# Patient Record
Sex: Male | Born: 1945
Health system: Southern US, Community
[De-identification: ages and names within clinical notes are randomized; demographics above are authoritative.]

## PROBLEM LIST (undated history)

## (undated) DIAGNOSIS — C61 Malignant neoplasm of prostate: Secondary | ICD-10-CM

## (undated) DIAGNOSIS — I10 Essential (primary) hypertension: Secondary | ICD-10-CM

## (undated) DIAGNOSIS — M1990 Unspecified osteoarthritis, unspecified site: Secondary | ICD-10-CM

## (undated) HISTORY — PX: PROSTATE CRYOABLATION: SUR358

## (undated) HISTORY — PX: APPENDECTOMY: SHX54

## (undated) HISTORY — PX: OTHER SURGICAL HISTORY: SHX169

## (undated) HISTORY — DX: Essential (primary) hypertension: I10

## (undated) HISTORY — DX: Malignant neoplasm of prostate: C61

## (undated) HISTORY — PX: NASAL SINUS SURGERY: SHX719

## (undated) HISTORY — DX: Unspecified osteoarthritis, unspecified site: M19.90

---

## 2018-08-11 DIAGNOSIS — N183 Chronic kidney disease, stage 3 unspecified: Secondary | ICD-10-CM

## 2018-08-11 HISTORY — DX: Chronic kidney disease, stage 3 unspecified: N18.30

## 2019-09-28 ENCOUNTER — Ambulatory Visit (INDEPENDENT_AMBULATORY_CARE_PROVIDER_SITE_OTHER): Payer: Medicare Other | Admitting: Medical

## 2019-09-28 ENCOUNTER — Encounter: Payer: Self-pay | Admitting: Medical

## 2019-09-28 ENCOUNTER — Other Ambulatory Visit: Payer: Self-pay

## 2019-09-28 VITALS — BP 158/90 | HR 83 | Temp 98.2°F | Ht 71.0 in | Wt 195.4 lb

## 2019-09-28 DIAGNOSIS — R350 Frequency of micturition: Secondary | ICD-10-CM

## 2019-09-28 DIAGNOSIS — Z2820 Immunization not carried out because of patient decision for unspecified reason: Secondary | ICD-10-CM

## 2019-09-28 DIAGNOSIS — Z0000 Encounter for general adult medical examination without abnormal findings: Secondary | ICD-10-CM | POA: Insufficient documentation

## 2019-09-28 DIAGNOSIS — Z1211 Encounter for screening for malignant neoplasm of colon: Secondary | ICD-10-CM

## 2019-09-28 DIAGNOSIS — G4733 Obstructive sleep apnea (adult) (pediatric): Secondary | ICD-10-CM | POA: Diagnosis not present

## 2019-09-28 DIAGNOSIS — R7309 Other abnormal glucose: Secondary | ICD-10-CM | POA: Insufficient documentation

## 2019-09-28 DIAGNOSIS — C61 Malignant neoplasm of prostate: Secondary | ICD-10-CM

## 2019-09-28 DIAGNOSIS — N1831 Chronic kidney disease, stage 3a: Secondary | ICD-10-CM | POA: Insufficient documentation

## 2019-09-28 DIAGNOSIS — I10 Essential (primary) hypertension: Secondary | ICD-10-CM | POA: Diagnosis not present

## 2019-09-28 MED ORDER — AMLODIPINE BESYLATE 5 MG PO TABS: 5.0000 mg | ORAL_TABLET | Freq: Every day | ORAL | 0 refills | Status: DC

## 2019-09-28 MED ORDER — TAMSULOSIN HCL 0.4 MG PO CAPS: 0.4000 mg | ORAL_CAPSULE | Freq: Every day | ORAL | 0 refills | Status: DC

## 2019-09-28 MED ORDER — VALSARTAN-HYDROCHLOROTHIAZIDE 160-25 MG PO TABS: 1.0000 | ORAL_TABLET | Freq: Every day | ORAL | 0 refills | Status: DC

## 2019-09-28 NOTE — Patient Instructions (Addendum)
We will make a referral to [facility]  Consider Cologard stool test vs colonoscopy for colon cancer screen  We will check labs today.     Chronic Kidney Disease, Adult Chronic kidney disease (CKD) happens when the kidneys are damaged over a long period of time. The kidneys are two organs that help with:  Getting rid of waste and extra fluid from the blood.  Making hormones that maintain the amount of fluid in your tissues and blood vessels.  Making sure that the body has the right amount of fluids and chemicals. Most of the time, CKD does not go away, but it can usually be controlled. Steps must be taken to slow down the kidney damage or to stop it from getting worse. If this is not done, the kidneys may stop working. Follow these instructions at home: Medicines  Take over-the-counter and prescription medicines only as told by your doctor. You may need to change the amount of medicines you take.  Do not take any new medicines unless your doctor says it is okay. Many medicines can make your kidney damage worse.  Do not take any vitamin and supplements unless your doctor says it is okay. Many vitamins and supplements can make your kidney damage worse. General instructions  Follow a diet as told by your doctor. You may need to stay away from: ? Alcohol. ? Salty foods. ? Foods that are high in:  Potassium.  Calcium.  Protein.  Do not use any products that contain nicotine or tobacco, such as cigarettes and e-cigarettes. If you need help quitting, ask your doctor.  Keep track of your blood pressure at home. Tell your doctor about any changes.  If you have diabetes, keep track of your blood sugar as told by your doctor.  Try to stay at a healthy weight. If you need help, ask your doctor.  Exercise at least 30 minutes a day, 5 days a week.  Stay up-to-date with your shots (immunizations) as told by your doctor.  Keep all follow-up visits as told by your doctor. This  is important. Contact a doctor if:  Your symptoms get worse.  You have new symptoms. Get help right away if:  You have symptoms of end-stage kidney disease. These may include: ? Headaches. ? Numbness in your hands or feet. ? Easy bruising. ? Having hiccups often. ? Chest pain. ? Shortness of breath. ? Stopping of menstrual periods in women.  You have a fever.  You have very little pee (urine).  You have pain or bleeding when you pee. Summary  Chronic kidney disease (CKD) happens when the kidneys are damaged over a long period of time.  Most of the time, this condition does not go away, but it can usually be controlled. Steps must be taken to slow down the kidney damage or to stop it from getting worse.  Treatment may include a combination of medicines and lifestyle changes. This information is not intended to replace advice given to you by your health care provider. Make sure you discuss any questions you have with your health care provider. Document Revised: 07/10/2017 Document Reviewed: 09/01/2016 Elsevier Patient Education  2020 [employer].

## 2019-09-28 NOTE — Progress Notes (Signed)
Subjective:  Peter Russell is a 74 y.o. male who presents for Chief Complaint  Patient presents with  . New Patient (Initial Visit)    needs blood work and referral to neurology   . Hypertension    needs blood pressure medicine      Here as a new patient today to establish care and get urology referral.  He moved here in August 2020 from the [facility].  He worked up until last year, retiring at age 54-1/2.  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    His health issues include hypertension, history of prostate cancer, arthritis, and decreased urine stream but frequent urination.  Back in [place] he had several specialist  His prior PCP was Dr. Lucienne Minks  His prior urologist was Dr. Olin Pia in [place], [place].    He would see ear nose and throat from time to time for impacted cerumen  No prior colonoscopy  Typical avoids vaccines  HTN -compliant with medications, normal home readings but recently ran out of valsartan for about the past 2 to 3 weeks.  No prior stress test.  Was on cholesterol medicine in the past but ended up dropping this.  No other new complaint  No other aggravating or relieving factors.    No other c/o.  Past Medical History:  Diagnosis Date  . Arthritis    sternum and knees  . CKD (chronic kidney disease) stage 3, GFR 30-59 ml/min 2020  . Hypertension   . Prostate cancer [facility])     Past Surgical History:  Procedure Laterality Date  . APPENDECTOMY    . NASAL SINUS SURGERY    . PROSTATE CRYOABLATION      Social History   Socioeconomic History  . Marital status: Married    Spouse name: Not on file  . Number of children: Not on file  . Years of education: Not on file  . Highest education level: Not on file  Occupational History  . Not on file  Tobacco Use  . Smoking status: Former Research scientist (life sciences)  . Smokeless tobacco: Never Used  Substance and Sexual Activity  . Alcohol use: Never  . Drug use: Never  . Sexual activity: Not  on file  Other Topics Concern  . 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 Was living in [place] prior, moved to [place] fall 2020.   As of  09/2019   Social Determinants of Health   Financial Resource Strain:   . Difficulty of Paying Living Expenses: Not on file  Food Insecurity:   . Worried About Charity fundraiser in the Last Year: Not on file  . Ran Out of Food in the Last Year: Not on file  Transportation Needs:   . Lack of Transportation (Medical): Not on file  . Lack of Transportation (Non-Medical): Not on file  Physical Activity:   . Days of Exercise per Week: Not on file  . Minutes of Exercise per Session: Not on file  Stress:   . Feeling of Stress : Not on file  Social Connections:   . Frequency of Communication with Friends and Family: Not on file  . Frequency of Social Gatherings with Friends and Family: Not on file  . Attends Religious Services: Not on file  . Active Member of Clubs or Organizations: Not on file  . Attends Archivist Meetings: Not on file  .  Marital Status: Not on file  Intimate Partner Violence:   . Fear of Current or Ex-Partner: Not on file  . Emotionally Abused: Not on file  . Physically Abused: Not on file  . Sexually Abused: Not on file    Family History  Problem Relation Age of Onset  . Alcohol abuse Father   . Diabetes Father   . Cancer Brother        prostate     Current Outpatient Medications:  .  amLODipine (NORVASC) 5 MG tablet, Take 1 tablet (5 mg total) by mouth daily., Disp: 90 tablet, Rfl: 0 .  tamsulosin (FLOMAX) 0.4 MG CAPS capsule, Take 1 capsule (0.4 mg total) by mouth daily., Disp: 90 capsule, Rfl: 0 .  valsartan-hydrochlorothiazide (DIOVAN-HCT) 160-25 MG tablet, Take 1 tablet by mouth daily., Disp: 90 tablet, Rfl: 0  No Known Allergies   The following portions of the patient's history were  reviewed and updated as appropriate: allergies, current medications, past family history, past medical history, past social history, past surgical history and problem list.  ROS Otherwise as in subjective above  Objective: BP (!) 158/90   Pulse 83   Temp 98.2 F (36.8 C)   Ht 5\' 11"  (1.803 m)   Wt 195 lb 6.4 oz (88.6 kg)   SpO2 98%   BMI 27.25 kg/m   General appearance: alert, no distress, well developed, well nourished, white male Neck: supple, no lymphadenopathy, no thyromegaly, no masses, no bruits Heart: RRR, normal S1, S2, faint 2/6 brief systolic murmur heard in left upper sternal border Lungs: CTA bilaterally, no wheezes, rhonchi, or rales Abdomen: +bs, soft, non tender, non distended, no masses, no hepatomegaly, no splenomegaly, no bruits Pulses: 2+ radial pulses, 2+ pedal pulses, normal cap refill Ext: no edema    Assessment: Encounter Diagnoses  Name Primary?  . Prostate cancer ([place]) Yes  . Essential hypertension, benign   . Urine frequency   . OSA (obstructive sleep apnea)   . Chronic kidney disease, stage 3a   . Elevated glucose   . Vaccine refused by patient   . Screen for colon cancer      Plan: History of prostate cancer with prior cryoablation surgery, including treatments in 2013, 2018, last PSA May of this past year 2020 was around 5.  He continues on tamsulosin for lower tract symptoms.  Referral to urology  Hypertension-he notes normal home readings but has been out of medicine the last 2 weeks.  Refills today.  Labs today.  CKD 3-reviewing his labs he appears to have chronic kidney disease although he was not aware of this.  We discussed this diagnosis, the relationship to high blood pressure.  Labs today.  Avoid NSAIDs.  Elevated glucose-repeat labs today.  I reviewed labs he had done about a year ago showing mildly elevated glucose  In our general discussion today he seemed to be pretty set on avoiding vaccines.  We discussed given his age he is  high risk for certain things like shingles and pneumonia but he declines vaccines today.  We discussed Cologuard versus colonoscopy.  He will think about this and consider.  He has never had a colon cancer screen   [facility] was seen today for new patient (initial visit) and hypertension.  Diagnoses and all orders for this visit:  Prostate cancer ([place]) -     PSA -     Ambulatory referral to Urology -     Ambulatory referral to Urology  Essential hypertension, benign -  Comprehensive metabolic panel -     Lipid panel  Urine frequency -     Comprehensive metabolic panel  OSA (obstructive sleep apnea)  Chronic kidney disease, stage 3a -     Comprehensive metabolic panel -     CBC with Differential/Platelet -     Lipid panel  Elevated glucose -     Comprehensive metabolic panel -     Hemoglobin A1c  Vaccine refused by patient  Screen for colon cancer  Other orders -     amLODipine (NORVASC) 5 MG tablet; Take 1 tablet (5 mg total) by mouth daily. -     valsartan-hydrochlorothiazide (DIOVAN-HCT) 160-25 MG tablet; Take 1 tablet by mouth daily. -     tamsulosin (FLOMAX) 0.4 MG CAPS capsule; Take 1 capsule (0.4 mg total) by mouth daily.    Follow up: pending labs, call back

## 2019-09-29 LAB — CBC WITH DIFFERENTIAL/PLATELET
Basophils Absolute: 0 10*3/uL (ref 0.0–0.2)
Basos: 1 %
EOS (ABSOLUTE): 0.2 10*3/uL (ref 0.0–0.4)
Eos: 3 %
Hematocrit: 41.4 % (ref 37.5–51.0)
Hemoglobin: 14.3 g/dL (ref 13.0–17.7)
Immature Grans (Abs): 0 10*3/uL (ref 0.0–0.1)
Immature Granulocytes: 0 %
Lymphocytes Absolute: 1.3 10*3/uL (ref 0.7–3.1)
Lymphs: 20 %
MCH: 29.7 pg (ref 26.6–33.0)
MCHC: 34.5 g/dL (ref 31.5–35.7)
MCV: 86 fL (ref 79–97)
Monocytes Absolute: 0.6 10*3/uL (ref 0.1–0.9)
Monocytes: 9 %
Neutrophils Absolute: 4.4 10*3/uL (ref 1.4–7.0)
Neutrophils: 67 %
Platelets: 275 10*3/uL (ref 150–450)
RBC: 4.81 x10E6/uL (ref 4.14–5.80)
RDW: 13.5 % (ref 11.6–15.4)
WBC: 6.5 10*3/uL (ref 3.4–10.8)

## 2019-09-29 LAB — COMPREHENSIVE METABOLIC PANEL
ALT: 16 IU/L (ref 0–44)
AST: 16 IU/L (ref 0–40)
Albumin/Globulin Ratio: 1.6 (ref 1.2–2.2)
Albumin: 4.4 g/dL (ref 3.7–4.7)
Alkaline Phosphatase: 112 IU/L (ref 39–117)
BUN/Creatinine Ratio: 16 (ref 10–24)
BUN: 18 mg/dL (ref 8–27)
Bilirubin Total: 0.6 mg/dL (ref 0.0–1.2)
CO2: 25 mmol/L (ref 20–29)
Calcium: 9 mg/dL (ref 8.6–10.2)
Chloride: 100 mmol/L (ref 96–106)
Creatinine, Ser: 1.1 mg/dL (ref 0.76–1.27)
GFR calc Af Amer: 77 mL/min/{1.73_m2} (ref >59)
GFR calc non Af Amer: 67 mL/min/{1.73_m2} (ref >59)
Globulin, Total: 2.8 g/dL (ref 1.5–4.5)
Glucose: 99 mg/dL (ref 65–99)
Potassium: 3.7 mmol/L (ref 3.5–5.2)
Sodium: 140 mmol/L (ref 134–144)
Total Protein: 7.2 g/dL (ref 6.0–8.5)

## 2019-09-29 LAB — HEMOGLOBIN A1C
Est. average glucose Bld gHb Est-mCnc: 114 mg/dL
Hgb A1c MFr Bld: 5.6 % (ref 4.8–5.6)

## 2019-09-29 LAB — LIPID PANEL
Chol/HDL Ratio: 4.2 ratio (ref 0.0–5.0)
Cholesterol, Total: 210 mg/dL — ABNORMAL HIGH (ref 100–199)
HDL: 50 mg/dL (ref >39)
LDL Chol Calc (NIH): 141 mg/dL — ABNORMAL HIGH (ref 0–99)
Triglycerides: 105 mg/dL (ref 0–149)
VLDL Cholesterol Cal: 19 mg/dL (ref 5–40)

## 2019-09-29 LAB — PSA: Prostate Specific Ag, Serum: 16.5 ng/mL — ABNORMAL HIGH (ref 0.0–4.0)

## 2019-11-07 ENCOUNTER — Encounter: Payer: Self-pay | Admitting: Medical

## 2019-11-10 ENCOUNTER — Other Ambulatory Visit (HOSPITAL_COMMUNITY): Payer: Self-pay | Admitting: Urology

## 2019-11-10 ENCOUNTER — Other Ambulatory Visit: Payer: Self-pay | Admitting: Urology

## 2019-11-10 DIAGNOSIS — R9721 Rising PSA following treatment for malignant neoplasm of prostate: Secondary | ICD-10-CM

## 2019-11-30 ENCOUNTER — Other Ambulatory Visit: Payer: Self-pay

## 2019-11-30 ENCOUNTER — Ambulatory Visit (HOSPITAL_COMMUNITY)
Admission: RE | Admit: 2019-11-30 | Discharge: 2019-11-30 | Disposition: A | Discharge status: Home / Self Care | Payer: Medicare Other | Source: Ambulatory Visit | Attending: Urology | Admitting: Urology

## 2019-11-30 ENCOUNTER — Encounter (HOSPITAL_COMMUNITY)
Admission: RE | Admit: 2019-11-30 | Discharge: 2019-11-30 | Disposition: A | Discharge status: Home / Self Care | Payer: Medicare Other | Source: Ambulatory Visit | Attending: Urology | Admitting: Urology

## 2019-11-30 DIAGNOSIS — R9721 Rising PSA following treatment for malignant neoplasm of prostate: Secondary | ICD-10-CM | POA: Diagnosis not present

## 2019-11-30 MED ORDER — TECHNETIUM TC 99M MEDRONATE IV KIT
20.4000 | PACK | Freq: Once | INTRAVENOUS | Status: AC | PRN
  Administered 2019-11-30: 20.4 via INTRAVENOUS

## 2019-12-27 ENCOUNTER — Other Ambulatory Visit: Payer: Self-pay | Admitting: Medical

## 2020-02-02 ENCOUNTER — Ambulatory Visit (INDEPENDENT_AMBULATORY_CARE_PROVIDER_SITE_OTHER): Payer: Medicare Other | Admitting: Medical

## 2020-02-02 ENCOUNTER — Other Ambulatory Visit: Payer: Self-pay

## 2020-02-02 ENCOUNTER — Encounter: Payer: Self-pay | Admitting: Medical

## 2020-02-02 VITALS — BP 128/70 | HR 70 | Ht 71.0 in | Wt 182.2 lb

## 2020-02-02 DIAGNOSIS — Z7189 Other specified counseling: Secondary | ICD-10-CM

## 2020-02-02 DIAGNOSIS — Z2820 Immunization not carried out because of patient decision for unspecified reason: Secondary | ICD-10-CM

## 2020-02-02 DIAGNOSIS — I10 Essential (primary) hypertension: Secondary | ICD-10-CM

## 2020-02-02 DIAGNOSIS — N1831 Chronic kidney disease, stage 3a: Secondary | ICD-10-CM | POA: Diagnosis not present

## 2020-02-02 DIAGNOSIS — R7309 Other abnormal glucose: Secondary | ICD-10-CM

## 2020-02-02 DIAGNOSIS — R350 Frequency of micturition: Secondary | ICD-10-CM

## 2020-02-02 DIAGNOSIS — Z136 Encounter for screening for cardiovascular disorders: Secondary | ICD-10-CM

## 2020-02-02 DIAGNOSIS — M1A9XX0 Chronic gout, unspecified, without tophus (tophi): Secondary | ICD-10-CM

## 2020-02-02 DIAGNOSIS — C61 Malignant neoplasm of prostate: Secondary | ICD-10-CM | POA: Diagnosis not present

## 2020-02-02 DIAGNOSIS — Z0000 Encounter for general adult medical examination without abnormal findings: Secondary | ICD-10-CM

## 2020-02-02 DIAGNOSIS — G4733 Obstructive sleep apnea (adult) (pediatric): Secondary | ICD-10-CM | POA: Diagnosis not present

## 2020-02-02 DIAGNOSIS — M069 Rheumatoid arthritis, unspecified: Secondary | ICD-10-CM | POA: Insufficient documentation

## 2020-02-02 DIAGNOSIS — Z20822 Contact with and (suspected) exposure to covid-19: Secondary | ICD-10-CM

## 2020-02-02 DIAGNOSIS — Z7185 Encounter for immunization safety counseling: Secondary | ICD-10-CM | POA: Insufficient documentation

## 2020-02-02 NOTE — Progress Notes (Addendum)
Subjective:    Peter Russell is a 74 y.o. male who presents for Preventative Services visit and chronic medical problems/med check visit.    Primary Care Provider Monnica Saltsman, Camelia Eng, PA-C here for primary care  Current Health Care Team:  Dentist, N/A  Eye doctor, N/A   Dr. Tresa Moore, [facility]    Medical Services you may have received from other than [facility] providers in the past year (date may be approximate) Dr. [facility]  Exercise Current exercise habits: walking 45 minutes 5 days a week   Nutrition/Diet Current diet: in general, a "healthy" diet    Depression Screen Depression screen [facility] 2/9 02/02/2020  Decreased Interest 0  Down, Depressed, Hopeless 0  PHQ - 2 Score 0    Activities of Daily Living Screen/Functional Status Survey Is the patient deaf or have difficulty hearing?: Yes Does the patient have difficulty seeing, even when wearing glasses/contacts?: Yes Does the patient have difficulty concentrating, remembering, or making decisions?: No Does the patient have difficulty walking or climbing stairs?: Yes (knee stiffness) Does the patient have difficulty dressing or bathing?: No Does the patient have difficulty doing errands alone such as visiting a doctor's office or shopping?: No  Can patient draw a clock face showing 11:00, yes   Fall Risk Screen Fall Risk  02/02/2020 09/28/2019  Falls in the past year? 0 0    Gait Assessment: Normal gait observed:  No, guarded with knees due to pain   Advanced directives Does patient have a [place]? No Does patient have a Living Will? No    Past Medical History:  Diagnosis Date  . Arthritis    sternum and knees  . CKD (chronic kidney disease) stage 3, GFR 30-59 ml/min 2020  . Hypertension   . Prostate cancer [facility])     Past Surgical History:  Procedure Laterality Date  . APPENDECTOMY    . NASAL SINUS SURGERY    . PROSTATE CRYOABLATION      Social History    Socioeconomic History  . Marital status: Married    Spouse name: Not on file  . Number of children: Not on file  . Years of education: Not on file  . Highest education level: Not on file  Occupational History  . Not on file  Tobacco Use  . Smoking status: Former Research scientist (life sciences)  . Smokeless tobacco: Never Used  Vaping Use  . Vaping Use: Never used  Substance and Sexual Activity  . Alcohol use: Never  . Drug use: Never  . Sexual activity: Not on file  Other Topics Concern  . 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  Has grown children.  Was living in [place] prior, moved to [place] fall 2020.   As of  09/2019   Social Determinants of Health   Financial Resource Strain:   . Difficulty of Paying Living Expenses:   Food Insecurity:   . Worried About Charity fundraiser in the Last Year:   . Arboriculturist in the Last Year:   Transportation Needs:   . Film/video editor (Medical):   Marland Kitchen Lack of Transportation (Non-Medical):   Physical Activity:   . Days of Exercise per Week:   . Minutes of Exercise per Session:   Stress:   . Feeling of Stress :   Social Connections:   . Frequency of Communication with Friends and  Family:   . Frequency of Social Gatherings with Friends and Family:   . Attends Religious Services:   . Active Member of Clubs or Organizations:   . Attends Archivist Meetings:   Marland Kitchen Marital Status:   Intimate Partner Violence:   . Fear of Current or Ex-Partner:   . Emotionally Abused:   Marland Kitchen Physically Abused:   . Sexually Abused:     Family History  Problem Relation Age of Onset  . Alcohol abuse Father   . Diabetes Father   . Cancer Brother        prostate     Current Outpatient Medications:  .  amLODipine (NORVASC) 5 MG tablet, Take 1 tablet by mouth once daily, Disp: 90 tablet, Rfl: 0 .  Coenzyme Q10 100 MG capsule, Take by  mouth., Disp: , Rfl:  .  cyanocobalamin ([employer] VITAMIN B12) 1000 MCG tablet, Take by mouth., Disp: , Rfl:  .  Garlic 10 MG CAPS, Take by mouth., Disp: , Rfl:  .  MAGNESIUM PO, Take by mouth., Disp: , Rfl:  .  Omega-3 Fatty Acids (FISH OIL) 1000 MG CAPS, Take by mouth., Disp: , Rfl:  .  tamsulosin (FLOMAX) 0.4 MG CAPS capsule, Take 1 capsule by mouth once daily, Disp: 90 capsule, Rfl: 0 .  valsartan-hydrochlorothiazide (DIOVAN-HCT) 160-25 MG tablet, Take 1 tablet by mouth daily, Disp: 90 tablet, Rfl: 0 .  XTANDI 40 MG capsule, Take 160 mg by mouth daily., Disp: , Rfl:   No Known Allergies  History reviewed: allergies, current medications, past family history, past medical history, past social history, past surgical history and problem list  Chronic issues discussed: prostate cancer ,recent established with Dr. Tresa Moore with [facility], androgen deprivation therapy for prostate cancer  Having some flare up of knee pains.  Has hx/o gout and arthritis.  Having some pains in right great toe.  Does take an OTC uric acid reducer.   Walks daily.  Trying to limit meat and processed foods.  Uses a juicer.   Objective:      Biometrics BP 128/70   Pulse 70   Ht 5\' 11"  (1.803 m)   Wt 182 lb 3.2 oz (82.6 kg)   SpO2 97%   BMI 25.41 kg/m    Wt Readings from Last 3 Encounters:  02/02/20 182 lb 3.2 oz (82.6 kg)  09/28/19 195 lb 6.4 oz (88.6 kg)   BP Readings from Last 3 Encounters:  02/02/20 128/70  09/28/19 (!) 158/90     Cognitive Testing  Alert? Yes  Normal Appearance?Yes  Oriented to person? Yes  Place? Yes   Time? Yes  Recall of three objects?  Yes  Can perform simple calculations? Yes  Displays appropriate judgment?Yes  Can read the correct time from a watch face?Yes  General appearance: alert, no distress, WD/WN, white male  Nutritional Status: Inadequate calore intake? no Loss of muscle mass? no Loss of fat beneath skin? no Localized or general edema? no Diminished  functional status? no  Other pertinent exam: Neck: supple, no lymphadenopathy, no thyromegaly, no masses, no bruits Skin: Scattered macules and skin tags neck and torso anterior and posterior, cherry hemangiomas as well small benign, no worrisome lesions Heart: Possible faint click in brief systolic murmur heard best at upper sternal borders, otherwise RRR, normal S1, S2, Lungs: CTA bilaterally, no wheezes, rhonchi, or rales Abdomen: +bs, soft, non tender, non distended, no masses, no hepatomegaly, no splenomegaly Musculoskeletal: Arthritic bony changes of knees bilaterally, no obvious swelling  or laxity, right great toe  With mild tendnerss at MCP, otherwise no swelling, no erythema, some thickened nails of right great and second toe, nontender, no swelling, no obvious deformity Extremities: no edema, no cyanosis, no clubbing Pulses: 2+ symmetric, upper and lower extremities, normal cap refill Neurological: alert, oriented x 3, CN2-12 intact, strength normal upper extremities and lower extremities, sensation normal throughout, DTRs 2+ throughout, no cerebellar signs, gait normal Psychiatric: normal affect, behavior normal, pleasant  GU/rectal - declined  EKG Indication screen for heart disease, hypertension, rate 57 bpm, PR 136 ms, QRS 92 ms, QTC 434 ms, axis 31 degrees, sinus bradycardia, there is some nonspecific ST changes in V3 through V6, P wave enlargement in 2 and 3 suggestive atrial enlargement  Assessment:   Encounter Diagnoses  Name Primary?  . Essential hypertension, benign Yes  . Prostate cancer ([place])   . Medicare annual wellness visit, subsequent   . OSA (obstructive sleep apnea)   . Chronic kidney disease, stage 3a   . Elevated glucose   . Vaccine refused by patient   . Urine frequency   . Screening for heart disease   . Vaccine counseling   . Chronic gout without tophus, unspecified cause, unspecified site   . Advance directive discussed with patient   . Close  exposure to COVID-19 virus      Plan:   A preventative services visit was completed today.  During the course of the visit today, we discussed and counseled about appropriate screening and preventive services.  A health risk assessment was established today that included a review of current medications, allergies, social history, family history, medical and preventative health history, biometrics, and preventative screenings to identify potential safety concerns or impairments.  A personalized plan was printed today for your records and use.   Personalized health advice and education was given today to reduce health risks and promote self management and wellness.  Information regarding end of life planning was discussed today.  Chronic problems discussed today: arthritis of knees -bony arthritic knees.  Discussed use of topical creams over-the-counter, Tylenol, other treatment if needed.  Hypertension-continue current medication  We discussed heart disease screening.  EKG reviewed. Recommend cardiology consult. Murmur slight click on heart exam, EKG findings, and other risk factors including age and hypertension    Acute problems discussed today: Gout ?, toe pain - labs today, discussed acute pain medications, Tylenol, topical creams over-the-counter, prescription medicines.  Follow-up pending labs    Recommendations:  I recommend a yearly ophthalmology/optometry visit for glaucoma screening and eye checkup  I recommended a yearly dental visit for hygiene and checkup  Advanced directives - discussed nature and purpose of Advanced Directives, encouraged them to complete them if they have not done so and/or encouraged them to get [place] a copy if they have done this already.  Referrals today: Consider cardiology consult  Cancer screening He recently established with [facility] for prostate cancer history, on therapy  We discussed pros and cons of colon cancer screening,  discussed recommendations from [place] preventative services task force.  He declines colon cancer screening  We discussed skin surveillance/skin cancer screen    Immunizations: Counseled on several vaccines including shingles, Covid, tetanus, Pneumovax, flu.  He declines all of them  Caedyn was seen today for medicare wellness.  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    Medicare Attestation A preventative services visit was completed today.  During the course of the visit the patient was educated and counseled about appropriate screening and preventive services.  A health risk assessment was established with the patient that included a review of current medications, allergies, social history, family history, medical and preventative health history, biometrics, and preventative screenings to identify potential safety concerns or impairments.  A personalized plan was printed today for the patient's records and use.   Personalized health advice and education was given today to reduce health risks and promote self management and wellness.  Information regarding end of life planning was discussed today.  Dorothea Ogle, PA-C   02/02/2020

## 2020-02-02 NOTE — Patient Instructions (Signed)
It was a pleasure to see you today  Recommendations See your eye doctor yearly  See your dentist yearly  Follow-up with urology as planned   Cancer screens: Consider Cologuard colon cancer screening. If you want to do this let me know and we will refer  Check your skin regularly for new skin moles or lesions    Vaccines: You are due for a whole host of vaccines including yearly flu shot, Prevnar 13 and pneumococcal 23 pneumococcal vaccines, Shingrix shingles vaccine, Covid vaccine, tetanus booster  If you would like to do any of these vaccines call back to schedule. Check insurance coverage to make sure they are covered by insurance     Heart disease screening Your EKG is abnormal, and you have risk factors for heart disease including age over 46 and hypertension  Therefore, it would be a reasonable idea to have a baseline heart doctor evaluation. If you are agreeable I will refer to cardiology    Arthritis of the knees, possible gout We are checking labs today  You can use topical remedies such as Aspercreme or capsaicin cream topically to the knees, you can use Tylenol oral tablet over-the-counter, sometimes we do steroid injections into the knees, sometimes we use stronger pain medicine as needed    Advance Directive  Advance directives are legal documents that let you make choices ahead of time about your health care and medical treatment in case you become unable to communicate for yourself. Advance directives are a way for you to make known your wishes to family, friends, and health care providers. This can let others know about your end-of-life care if you become unable to communicate. Discussing and writing advance directives should happen over time rather than all at once. Advance directives can be changed depending on your situation and what you want, even after you have signed the advance directives. There are different types of advance directives, such  as:  Medical power of attorney.  Living will.  Do not resuscitate (DNR) or do not attempt resuscitation (DNAR) order. Health care proxy and medical power of attorney A health care proxy is also called a health care agent. This is a person who is appointed to make medical decisions for you in cases where you are unable to make the decisions yourself. Generally, people choose someone they know well and trust to represent their preferences. Make sure to ask this person for an agreement to act as your proxy. A proxy may have to exercise judgment in the event of a medical decision for which your wishes are not known. A medical power of attorney is a legal document that names your health care proxy. Depending on the laws in your state, after the document is written, it may also need to be:  Signed.  Notarized.  Dated.  Copied.  Witnessed.  Incorporated into your medical record. You may also want to appoint someone to manage your money in a situation in which you are unable to do so. This is called a durable power of attorney for finances. It is a separate legal document from the durable power of attorney for health care. You may choose the same person or someone different from your health care proxy to act as your agent in money matters. If you do not appoint a proxy, or if there is a concern that the proxy is not acting in your best interests, a court may appoint a guardian to act on your behalf. Living will A living will  is a set of instructions that state your wishes about medical care when you cannot express them yourself. Health care providers should keep a copy of your living will in your medical record. You may want to give a copy to family members or friends. To alert caregivers in case of an emergency, you can place a card in your wallet to let them know that you have a living will and where they can find it. A living will is used if you become:  Terminally ill.  Disabled.  Unable  to communicate or make decisions. Items to consider in your living will include:  To use or not to use life-support equipment, such as dialysis machines and breathing machines (ventilators).  A DNR or DNAR order. This tells health care providers not to use cardiopulmonary resuscitation (CPR) if breathing or heartbeat stops.  To use or not to use tube feeding.  To be given or not to be given food and fluids.  Comfort (palliative) care when the goal becomes comfort rather than a cure.  Donation of organs and tissues. A living will does not give instructions for distributing your money and property if you should pass away. DNR or DNAR A DNR or DNAR order is a request not to have CPR in the event that your heart stops beating or you stop breathing. If a DNR or DNAR order has not been made and shared, a health care provider will try to help any patient whose heart has stopped or who has stopped breathing. If you plan to have surgery, talk with your health care provider about how your DNR or DNAR order will be followed if problems occur. What if I do not have an advance directive? If you do not have an advance directive, some states assign family decision makers to act on your behalf based on how closely you are related to them. Each state has its own laws about advance directives. You may want to check with your health care provider, attorney, or state representative about the laws in your state. Summary  Advance directives are the legal documents that allow you to make choices ahead of time about your health care and medical treatment in case you become unable to tell others about your care.  The process of discussing and writing advance directives should happen over time. You can change the advance directives, even after you have signed them.  Advance directives include DNR or DNAR orders, living wills, and designating an agent as your medical power of attorney. This information is not  intended to replace advice given to you by your health care provider. Make sure you discuss any questions you have with your health care provider. Document Revised: 02/24/2019 Document Reviewed: 02/24/2019 Elsevier Patient Education  [place].

## 2020-02-03 LAB — SAR COV2 SEROLOGY (COVID19)AB(IGG),IA: DiaSorin SARS-CoV-2 Ab, IgG: POSITIVE

## 2020-02-03 LAB — URIC ACID: Uric Acid: 6.8 mg/dL (ref 3.8–8.4)

## 2020-03-23 ENCOUNTER — Other Ambulatory Visit: Payer: Self-pay | Admitting: Medical

## 2020-05-30 NOTE — Addendum Note (Signed)
Addended by: Carlena Hurl on: 05/30/2020 10:10 PM   Modules accepted: Level of Service

## 2020-06-21 ENCOUNTER — Other Ambulatory Visit: Payer: Self-pay

## 2020-06-22 MED ORDER — AMLODIPINE BESYLATE 5 MG PO TABS
5.0000 mg | ORAL_TABLET | Freq: Every day | ORAL | 0 refills | Status: DC
Start: 2020-06-22 — End: 2020-10-26

## 2020-06-22 MED ORDER — VALSARTAN-HYDROCHLOROTHIAZIDE 160-25 MG PO TABS: 1.0000 | ORAL_TABLET | Freq: Every day | ORAL | 0 refills | Status: DC

## 2020-06-22 MED ORDER — TAMSULOSIN HCL 0.4 MG PO CAPS
0.4000 mg | ORAL_CAPSULE | Freq: Every day | ORAL | 0 refills | Status: DC
Start: 2020-06-22 — End: 2020-10-26

## 2020-09-25 ENCOUNTER — Other Ambulatory Visit: Payer: Self-pay | Admitting: Medical

## 2020-10-26 ENCOUNTER — Other Ambulatory Visit: Payer: Self-pay | Admitting: Medical

## 2020-11-02 ENCOUNTER — Ambulatory Visit: Payer: BC Managed Care – PPO | Admitting: Medical

## 2020-11-23 ENCOUNTER — Other Ambulatory Visit: Payer: Self-pay | Admitting: Medical

## 2021-02-15 ENCOUNTER — Encounter: Payer: Self-pay | Admitting: Medical

## 2021-02-15 ENCOUNTER — Ambulatory Visit (INDEPENDENT_AMBULATORY_CARE_PROVIDER_SITE_OTHER): Payer: Medicare Other | Admitting: Medical

## 2021-02-15 ENCOUNTER — Other Ambulatory Visit: Payer: Self-pay

## 2021-02-15 VITALS — BP 130/80 | HR 84 | Ht 69.25 in | Wt 199.6 lb

## 2021-02-15 DIAGNOSIS — Z7189 Other specified counseling: Secondary | ICD-10-CM

## 2021-02-15 DIAGNOSIS — I10 Essential (primary) hypertension: Secondary | ICD-10-CM

## 2021-02-15 DIAGNOSIS — Z7185 Encounter for immunization safety counseling: Secondary | ICD-10-CM

## 2021-02-15 DIAGNOSIS — M816 Localized osteoporosis [Lequesne]: Secondary | ICD-10-CM

## 2021-02-15 DIAGNOSIS — R7309 Other abnormal glucose: Secondary | ICD-10-CM

## 2021-02-15 DIAGNOSIS — Z8739 Personal history of other diseases of the musculoskeletal system and connective tissue: Secondary | ICD-10-CM

## 2021-02-15 DIAGNOSIS — Z2820 Immunization not carried out because of patient decision for unspecified reason: Secondary | ICD-10-CM | POA: Diagnosis not present

## 2021-02-15 DIAGNOSIS — R935 Abnormal findings on diagnostic imaging of other abdominal regions, including retroperitoneum: Secondary | ICD-10-CM

## 2021-02-15 DIAGNOSIS — M1A9XX0 Chronic gout, unspecified, without tophus (tophi): Secondary | ICD-10-CM

## 2021-02-15 DIAGNOSIS — Z0000 Encounter for general adult medical examination without abnormal findings: Secondary | ICD-10-CM | POA: Diagnosis not present

## 2021-02-15 DIAGNOSIS — Z1211 Encounter for screening for malignant neoplasm of colon: Secondary | ICD-10-CM

## 2021-02-15 DIAGNOSIS — N1831 Chronic kidney disease, stage 3a: Secondary | ICD-10-CM

## 2021-02-15 DIAGNOSIS — C7951 Secondary malignant neoplasm of bone: Secondary | ICD-10-CM | POA: Insufficient documentation

## 2021-02-15 DIAGNOSIS — H547 Unspecified visual loss: Secondary | ICD-10-CM

## 2021-02-15 DIAGNOSIS — G4733 Obstructive sleep apnea (adult) (pediatric): Secondary | ICD-10-CM

## 2021-02-15 DIAGNOSIS — Z136 Encounter for screening for cardiovascular disorders: Secondary | ICD-10-CM

## 2021-02-15 DIAGNOSIS — R1310 Dysphagia, unspecified: Secondary | ICD-10-CM

## 2021-02-15 DIAGNOSIS — R350 Frequency of micturition: Secondary | ICD-10-CM

## 2021-02-15 DIAGNOSIS — C61 Malignant neoplasm of prostate: Secondary | ICD-10-CM

## 2021-02-15 NOTE — Progress Notes (Addendum)
Subjective:    Peter Russell is a 75 y.o. male who presents for Preventative Services visit and chronic medical problems/med check visit.    Primary Care Provider Tysinger, Camelia Eng, PA-C here for primary care  Current Health Care Team: Dentist, Dr. Noah Charon Eye doctor,- needs one Dr. Alexis Frock, urology  Medical Services you may have received from other than [facility] providers in the past year (date may be approximate) Dr. Tresa Moore- Urology   Exercise Current exercise habits:  walking outside 15-20 minutes    Nutrition/Diet Current diet: in general, a "healthy" diet    Depression Screen Depression screen [facility] 2/9 02/15/2021  Decreased Interest 0  Down, Depressed, Hopeless 0  PHQ - 2 Score 0    Activities of Daily Living Screen/Functional Status Survey Is the patient deaf or have difficulty hearing?: Yes (3 years ago, told he might need hearing aid) Does the patient have difficulty seeing, even when wearing glasses/contacts?: Yes (wants referral-) Does the patient have difficulty concentrating, remembering, or making decisions?: No Does the patient have difficulty walking or climbing stairs?: No Does the patient have difficulty dressing or bathing?: No Does the patient have difficulty doing errands alone such as visiting a doctor's office or shopping?: No  Can patient draw a clock face showing 3:15 oclock, yes  Fall Risk Screen Fall Risk  02/15/2021 02/02/2020 09/28/2019  Falls in the past year? 0 0 0  Number falls in past yr: 0 - -  Injury with Fall? 0 - -  Risk for fall due to : No Fall Risks - -  Follow up Falls evaluation completed - -    Gait Assessment: Normal gait observed  - yes  Advanced directives Does patient have a [place]? No Does patient have a Living Will? No  Past Medical History:  Diagnosis Date   Arthritis    sternum and knees   CKD (chronic kidney disease) stage 3, GFR 30-59 ml/min (HCC) 2020   Hypertension    Prostate  cancer ([place])     Past Surgical History:  Procedure Laterality Date   APPENDECTOMY     NASAL SINUS SURGERY     PROSTATE CRYOABLATION      Social History   Socioeconomic History   Marital status: Married    Spouse name: Not on file   Number of children: Not on file   Years of education: Not on file   Highest education level: Not on file  Occupational History   Not on file  Tobacco Use   Smoking status: Former    Pack years: 0.00   Smokeless tobacco: Never  Vaping Use   Vaping Use: Never used  Substance and Sexual Activity   Alcohol use: Never   Drug use: Never   Sexual activity: Not on file  Other Topics Concern   Not on file  Social History Narrative   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  Has children.  Was living in [place] prior, moved to [place] fall 2020.   As of  02/2021   Social Determinants of Health   Financial Resource Strain: Not on file  Food Insecurity: Not on file  Transportation Needs: Not on file  Physical Activity: Not on file  Stress: Not on file  Social Connections: Not on file  Intimate Partner Violence: Not on file    Family History  Problem Relation Age of Onset   Alcohol abuse Father  Diabetes Father    Cancer Brother        prostate     Current Outpatient Medications:    amLODipine (NORVASC) 5 MG tablet, Take 1 tablet by mouth once daily, Disp: 30 tablet, Rfl: 2   ascorbic Acid (VITAMIN C) 500 MG CPCR, Take by mouth., Disp: , Rfl:    Coenzyme Q10 100 MG capsule, Take by mouth., Disp: , Rfl:    cyanocobalamin 1000 MCG tablet, Take by mouth., Disp: , Rfl:    Garlic 10 MG CAPS, Take 1,000 mg by mouth., Disp: , Rfl:    MAGNESIUM PO, Take by mouth., Disp: , Rfl:    Omega-3 Fatty Acids (FISH OIL) 1000 MG CAPS, Take by mouth., Disp: , Rfl:    Probiotic Product (PROBIOTIC DAILY PO), Take by mouth., Disp: , Rfl:    tamsulosin (FLOMAX) 0.4 MG CAPS capsule, Take  1 capsule by mouth once daily, Disp: 30 capsule, Rfl: 2   TURMERIC PO, Take by mouth., Disp: , Rfl:    valsartan-hydrochlorothiazide (DIOVAN-HCT) 160-25 MG tablet, TAKE 1 TABLET BY MOUTH ONCE DAILY . APPOINTMENT REQUIRED FOR FUTURE REFILLS, Disp: 30 tablet, Rfl: 2   vitamin E 180 MG (400 UNITS) capsule, Take by mouth., Disp: , Rfl:    XTANDI 40 MG capsule, Take 160 mg by mouth daily., Disp: , Rfl:   No Known Allergies  History reviewed: allergies, current medications, past family history, past medical history, past social history, past surgical history and problem list  Chronic issues discussed: Prostate cancer - seeing  urology, on therapy  HTN - compliant with medication    Acute issues discussed: Vision impairment, wants referral  Objective:      Biometrics BP 130/80   Pulse 84   Ht 5' 9.25" (1.759 m)   Wt 199 lb 9.6 oz (90.5 kg)   BMI 29.26 kg/m   Cognitive Testing  Alert? Yes  Normal Appearance?Yes  Oriented to person? Yes  Place? Yes   Time? Yes  Recall of three objects?  Yes  Can perform simple calculations? Yes  Displays appropriate judgment?Yes  Can read the correct time from a watch face?Yes  General appearance: alert, no distress, WD/WN, white male  Nutritional Status: Inadequate calore intake? no Loss of muscle mass? no Loss of fat beneath skin? no Localized or general edema? no Diminished functional status? no  Other pertinent exam: HEENT: normocephalic, sclerae anicteric, TMs pearly, nares patent, no discharge or erythema, pharynx normal Skin: Several scattered benign cherry hemangiomas of torso anterior and posterior, several scattered benign-appearing macules otherwise.  No worrisome lesions Oral cavity: MMM, no lesions Neck: supple, no lymphadenopathy, no thyromegaly, no masses, no deformity, relatively normal ROM Heart: RRR, normal S1, S2, no murmurs Lungs: CTA bilaterally, no wheezes, rhonchi, or rales Abdomen: +bs, soft, non tender, non  distended, no masses, no hepatomegaly, no splenomegaly Musculoskeletal: bony arthritic changes of bilat knee, otherwise nontender, no swelling, no obvious deformity Extremities: no edema, no cyanosis, no clubbing Pulses: 2+ symmetric, upper and lower extremities, normal cap refill Neurological: alert, oriented x 3, CN2-12 intact, strength normal upper extremities and lower extremities, sensation normal throughout, DTRs 2+ throughout, no cerebellar signs, gait normal Psychiatric: normal affect, behavior normal, pleasant  GU/rectal - deferred/declined    Assessment:   Encounter Diagnoses  Name Primary?   Medicare annual wellness visit, subsequent Yes   Prostate cancer ([place])    Bone metastases ([place])    Screening for heart disease    Vaccine refused by patient  Vaccine counseling    Urine frequency    Advance directive discussed with patient    Chronic gout without tophus, unspecified cause, unspecified site    Elevated glucose    Essential hypertension, benign    OSA (obstructive sleep apnea)    Chronic kidney disease, stage 3a (HCC)    Personal history of rheumatoid arthritis    Screen for colon cancer    Vision impairment    Dysphagia, unspecified type    Abnormal abdominal CT scan    Localized osteoporosis ([place])        Plan:   A preventative services visit was completed today.  During the course of the visit today, we discussed and counseled about appropriate screening and preventive services.  A health risk assessment was established today that included a review of current medications, allergies, social history, family history, medical and preventative health history, biometrics, and preventative screenings to identify potential safety concerns or impairments.   This visit was a preventative care visit, also known as wellness visit or routine physical.   Topics typically include healthy lifestyle, diet, exercise, preventative care, vaccinations, sick and well care,  proper use of emergency dept and after hours care, as well as other concerns.     Recommendations: Continue to return yearly for your annual wellness and preventative care visits.  This gives [place] a chance to discuss healthy lifestyle, exercise, vaccinations, review your chart record, and perform screenings where appropriate.  I recommend you see your eye doctor yearly for routine vision care. We are referring you to ophthalmology.  I recommend you see your dentist yearly for routine dental care including hygiene visits twice yearly.   Vaccination recommendations were reviewed  There is no immunization history on file for this patient.  Vaccines recommended at this age include the following: Yearly flu shot in the fall Shingles vaccine, 2 shots 2 months apart Pneumococcal vaccine called Prevnar 13, followed by pneumococcal 47 a year later COVID vaccination  Currently you declined vaccines    Screening for cancer: Colon cancer screening: Consider updated colon cancer screening.  You declined this today  We discussed PSA, prostate exam, and prostate cancer screening risks/benefits.     Skin cancer screening: Check your skin regularly for new changes, growing lesions, or other lesions of concern Come in for evaluation if you have skin lesions of concern.  Lung cancer screening: If you have a greater than 20 pack year history of tobacco use, then you may qualify for lung cancer screening with a chest CT scan.   Please call your insurance company to inquire about coverage for this test.  We currently don't have screenings for other cancers besides breast, cervical, colon, and lung cancers.  If you have a strong family history of cancer or have other cancer screening concerns, please let me know.    Bone health: Get at least 150 minutes of aerobic exercise weekly Get weight bearing exercise at least once weekly Bone density test:  A bone density test is an imaging test that uses  a type of X-ray to measure the amount of calcium and other minerals in your bones. The test may be used to diagnose or screen you for a condition that causes weak or thin bones (osteoporosis), predict your risk for a broken bone (fracture), or determine how well your osteoporosis treatment is working. The bone density test is recommended for females 57 and older, or females or males <81 if certain risk factors such as thyroid disease, long  term use of steroids such as for asthma or rheumatological issues, vitamin D deficiency, estrogen deficiency, family history of osteoporosis, self or family history of fragility fracture in first degree relative.  I recommend a bone density test given the bony metastases findings on prior scans.  I recommend we consider medication to help reduce the rate of bone loss.  Please call to schedule your bone density test.  [facility]  [phone] [address], [place], [place] [address]   Heart health: Get at least 150 minutes of aerobic exercise weekly Limit alcohol It is important to maintain a healthy blood pressure and healthy cholesterol numbers  Heart disease screening: Screening for heart disease includes screening for blood pressure, fasting lipids, glucose/diabetes screening, BMI height to weight ratio, reviewed of smoking status, physical activity, and diet.    Goals include blood pressure 120/80 or less, maintaining a healthy lipid/cholesterol profile, preventing diabetes or keeping diabetes numbers under good control, not smoking or using tobacco products, exercising most days per week or at least 150 minutes per week of exercise, and eating healthy variety of fruits and vegetables, healthy oils, and avoiding unhealthy food choices like fried food, fast food, high sugar and high cholesterol foods.    Other tests may possibly include EKG test, CT coronary calcium score, echocardiogram, exercise treadmill  stress test.   I recommend referral to cardiology for baseline evaluation.  Let me know if agreeable to this    Medical care options: I recommend you continue to seek care here first for routine care.  We try really hard to have available appointments Monday through Friday daytime hours for sick visits, acute visits, and physicals.  Urgent care should be used for after hours and weekends for significant issues that cannot wait till the next day.  The emergency department should be used for significant potentially life-threatening emergencies.  The emergency department is expensive, can often have long wait times for less significant concerns, so try to utilize primary care, urgent care, or telemedicine when possible to avoid unnecessary trips to the emergency department.  Virtual visits and telemedicine have been introduced since the pandemic started in 2020, and can be convenient ways to receive medical care.  We offer virtual appointments as well to assist you in a variety of options to seek medical care.    Separate significant issues discussed: Prostate cancer-he currently sees urology and had a recent follow-up.  I reviewed your March 2022 notes.  We need to look into bone density and possible treatment to help reduce rate of bone loss.  Please see your dentist to make sure they are aware that we might be starting medication to help with osteoporosis her bone density as some medications have dental risk.  I reviewed his urology notes from October 18, 2020 with Dr. Tresa Moore, initial diagnosis 2010 with grade 4 prostate cancer and a PSA of 8.4.  He had cryoablation x3 in [place].  He has continued on androgen deprivation therapy.  He continues on tamsulosin for LUTS.  They are considering putting him on Xgeva for bone disease but awaiting insurance clearance and dental clearance.  Bone metastases-call and schedule a bone density test we can get further information on moving forward with  treatment for preventing bone loss.  High blood pressure-continue current medication  History of chronic kidney disease-we will recheck kidney markers today.  Last year your kidney marker looked good.  History of rheumatoid arthritis not currently on treatment.  Labs today.  You may need to be have follow-up of rheumatoid depending on labs.  There are certain medicines that the standard of care that you are not currently utilizing.  Difficulty swallowing-if this continues to get worse we will consider swallow study for further evaluation.  Chew slowly, drink plenty of fluids with food, take small portions instead of large portions when eating  Chronic gout-I am rechecking labs today.  Overweight-I recommend you get exercise and eating healthy low-fat diet  I recommend you complete advance directives which include living will and healthcare power of attorney.  Please get [place] a copy of this as well     Arnol was seen today for fasting awv.  Diagnoses and all orders for this visit:  Medicare annual wellness visit, subsequent  Prostate cancer ([place]) -     CBC with Differential/Platelet -     Comprehensive metabolic panel -     DG Bone Density; Future  Bone metastases ([place]) -     DG Bone Density; Future  Screening for heart disease -     Lipid panel  Vaccine refused by patient  Vaccine counseling  Urine frequency  Advance directive discussed with patient  Chronic gout without tophus, unspecified cause, unspecified site -     CBC with Differential/Platelet -     Uric acid  Elevated glucose -     Comprehensive metabolic panel -     Hemoglobin A1c  Essential hypertension, benign -     CBC with Differential/Platelet -     Comprehensive metabolic panel  OSA (obstructive sleep apnea)  Chronic kidney disease, stage 3a (HCC)  Personal history of rheumatoid arthritis -     CBC with Differential/Platelet -     Comprehensive metabolic panel -     Sedimentation rate -      Uric acid -     CYCLIC CITRUL PEPTIDE ANTIBODY, IGG/IGA  Screen for colon cancer  Vision impairment -     Ambulatory referral to Ophthalmology  Dysphagia, unspecified type  Abnormal abdominal CT scan  Localized osteoporosis ([place])  -     DG Bone Density; Future  Spent > 45 minutes face to face with patient in discussion of symptoms, evaluation, plan and recommendations.    Medicare Attestation A preventative services visit was completed today.  During the course of the visit the patient was educated and counseled about appropriate screening and preventive services.  A health risk assessment was established with the patient that included a review of current medications, allergies, social history, family history, medical and preventative health history, biometrics, and preventative screenings to identify potential safety concerns or impairments.  A personalized plan was printed today for the patient's records and use.   Personalized health advice and education was given today to reduce health risks and promote self management and wellness.  Information regarding end of life planning was discussed today.  Dorothea Ogle, PA-C   02/15/2021

## 2021-02-15 NOTE — Patient Instructions (Signed)
This visit was a preventative care visit, also known as wellness visit or routine physical.   Topics typically include healthy lifestyle, diet, exercise, preventative care, vaccinations, sick and well care, proper use of emergency dept and after hours care, as well as other concerns.     Recommendations: Continue to return yearly for your annual wellness and preventative care visits.  This gives [place] a chance to discuss healthy lifestyle, exercise, vaccinations, review your chart record, and perform screenings where appropriate.  I recommend you see your eye doctor yearly for routine vision care. We are referring you to ophthalmology.  I recommend you see your dentist yearly for routine dental care including hygiene visits twice yearly.   Vaccination recommendations were reviewed  There is no immunization history on file for this patient.  Vaccines recommended at this age include the following: Yearly flu shot in the fall Shingles vaccine, 2 shots 2 months apart Pneumococcal vaccine called Prevnar 13, followed by pneumococcal 34 a year later COVID vaccination  Currently you declined vaccines    Screening for cancer: Colon cancer screening: Consider updated colon cancer screening.  You declined this today  We discussed PSA, prostate exam, and prostate cancer screening risks/benefits.     Skin cancer screening: Check your skin regularly for new changes, growing lesions, or other lesions of concern Come in for evaluation if you have skin lesions of concern.  Lung cancer screening: If you have a greater than 20 pack year history of tobacco use, then you may qualify for lung cancer screening with a chest CT scan.   Please call your insurance company to inquire about coverage for this test.  We currently don't have screenings for other cancers besides breast, cervical, colon, and lung cancers.  If you have a strong family history of cancer or have other cancer screening concerns,  please let me know.    Bone health: Get at least 150 minutes of aerobic exercise weekly Get weight bearing exercise at least once weekly Bone density test:  A bone density test is an imaging test that uses a type of X-ray to measure the amount of calcium and other minerals in your bones. The test may be used to diagnose or screen you for a condition that causes weak or thin bones (osteoporosis), predict your risk for a broken bone (fracture), or determine how well your osteoporosis treatment is working. The bone density test is recommended for females 51 and older, or females or males <16 if certain risk factors such as thyroid disease, long term use of steroids such as for asthma or rheumatological issues, vitamin D deficiency, estrogen deficiency, family history of osteoporosis, self or family history of fragility fracture in first degree relative.  I recommend a bone density test given the bony metastases findings on prior scans.  I recommend we consider medication to help reduce the rate of bone loss.  Please call to schedule your bone density test.  [facility]  [phone], [place], [place] [address]   Heart health: Get at least 150 minutes of aerobic exercise weekly Limit alcohol It is important to maintain a healthy blood pressure and healthy cholesterol numbers  Heart disease screening: Screening for heart disease includes screening for blood pressure, fasting lipids, glucose/diabetes screening, BMI height to weight ratio, reviewed of smoking status, physical activity, and diet.    Goals include blood pressure 120/80 or less, maintaining a healthy lipid/cholesterol profile, preventing diabetes or keeping diabetes numbers  under good control, not smoking or using tobacco products, exercising most days per week or at least 150 minutes per week of exercise, and eating healthy variety of fruits and vegetables, healthy oils, and  avoiding unhealthy food choices like fried food, fast food, high sugar and high cholesterol foods.    Other tests may possibly include EKG test, CT coronary calcium score, echocardiogram, exercise treadmill stress test.   I recommend referral to cardiology for baseline evaluation.  Let me know if agreeable to this    Medical care options: I recommend you continue to seek care here first for routine care.  We try really hard to have available appointments Monday through Friday daytime hours for sick visits, acute visits, and physicals.  Urgent care should be used for after hours and weekends for significant issues that cannot wait till the next day.  The emergency department should be used for significant potentially life-threatening emergencies.  The emergency department is expensive, can often have long wait times for less significant concerns, so try to utilize primary care, urgent care, or telemedicine when possible to avoid unnecessary trips to the emergency department.  Virtual visits and telemedicine have been introduced since the pandemic started in 2020, and can be convenient ways to receive medical care.  We offer virtual appointments as well to assist you in a variety of options to seek medical care.    Separate significant issues discussed: Prostate cancer-he currently sees urology and had a recent follow-up.  I reviewed your March 2022 notes.  We need to look into bone density and possible treatment to help reduce rate of bone loss.  Please see your dentist to make sure they are aware that we might be starting medication to help with osteoporosis her bone density as some medications have dental risk.  I reviewed his urology notes from October 18, 2020 with Dr. Tresa Moore, initial diagnosis 2010 with grade 4 prostate cancer and a PSA of 8.4.  He had cryoablation x3 in [place].  He has continued on androgen deprivation therapy.  He continues on tamsulosin for LUTS.  They are considering  putting him on Xgeva for bone disease but awaiting insurance clearance and dental clearance.  Bone metastases-call and schedule a bone density test we can get further information on moving forward with treatment for preventing bone loss.  High blood pressure-continue current medication  History of chronic kidney disease-we will recheck kidney markers today.  Last year your kidney marker looked good.  History of rheumatoid arthritis not currently on treatment.  Labs today.  You may need to be have follow-up of rheumatoid depending on labs.  There are certain medicines that the standard of care that you are not currently utilizing.  Difficulty swallowing-if this continues to get worse we will consider swallow study for further evaluation.  Chew slowly, drink plenty of fluids with food, take small portions instead of large portions when eating  Chronic gout-I am rechecking labs today.  Overweight-I recommend you get exercise and eating healthy low-fat diet  I recommend you complete advance directives which include living will and healthcare power of attorney.  Please get [place] a copy of this as well

## 2021-02-19 ENCOUNTER — Other Ambulatory Visit: Payer: Self-pay | Admitting: Medical

## 2021-02-19 LAB — URIC ACID: Uric Acid: 7.1 mg/dL (ref 3.8–8.4)

## 2021-02-19 LAB — CBC WITH DIFFERENTIAL/PLATELET
Basophils Absolute: 0 10*3/uL (ref 0.0–0.2)
Basos: 1 %
EOS (ABSOLUTE): 0.3 10*3/uL (ref 0.0–0.4)
Eos: 5 %
Hematocrit: 39.8 % (ref 37.5–51.0)
Hemoglobin: 14 g/dL (ref 13.0–17.7)
Immature Grans (Abs): 0 10*3/uL (ref 0.0–0.1)
Immature Granulocytes: 0 %
Lymphocytes Absolute: 1.2 10*3/uL (ref 0.7–3.1)
Lymphs: 23 %
MCH: 30.8 pg (ref 26.6–33.0)
MCHC: 35.2 g/dL (ref 31.5–35.7)
MCV: 88 fL (ref 79–97)
Monocytes Absolute: 0.4 10*3/uL (ref 0.1–0.9)
Monocytes: 8 %
Neutrophils Absolute: 3.3 10*3/uL (ref 1.4–7.0)
Neutrophils: 63 %
Platelets: 290 10*3/uL (ref 150–450)
RBC: 4.55 x10E6/uL (ref 4.14–5.80)
RDW: 12.7 % (ref 11.6–15.4)
WBC: 5.3 10*3/uL (ref 3.4–10.8)

## 2021-02-19 LAB — COMPREHENSIVE METABOLIC PANEL
ALT: 11 IU/L (ref 0–44)
AST: 17 IU/L (ref 0–40)
Albumin/Globulin Ratio: 1.8 (ref 1.2–2.2)
Albumin: 4.2 g/dL (ref 3.7–4.7)
Alkaline Phosphatase: 77 IU/L (ref 44–121)
BUN/Creatinine Ratio: 21 (ref 10–24)
BUN: 22 mg/dL (ref 8–27)
Bilirubin Total: 0.4 mg/dL (ref 0.0–1.2)
CO2: 25 mmol/L (ref 20–29)
Calcium: 9.2 mg/dL (ref 8.6–10.2)
Chloride: 100 mmol/L (ref 96–106)
Creatinine, Ser: 1.07 mg/dL (ref 0.76–1.27)
Globulin, Total: 2.4 g/dL (ref 1.5–4.5)
Glucose: 123 mg/dL — ABNORMAL HIGH (ref 65–99)
Potassium: 3.6 mmol/L (ref 3.5–5.2)
Sodium: 141 mmol/L (ref 134–144)
Total Protein: 6.6 g/dL (ref 6.0–8.5)
eGFR: 73 mL/min/{1.73_m2} (ref >59)

## 2021-02-19 LAB — LIPID PANEL
Chol/HDL Ratio: 4.3 ratio (ref 0.0–5.0)
Cholesterol, Total: 228 mg/dL — ABNORMAL HIGH (ref 100–199)
HDL: 53 mg/dL (ref >39)
LDL Chol Calc (NIH): 145 mg/dL — ABNORMAL HIGH (ref 0–99)
Triglycerides: 167 mg/dL — ABNORMAL HIGH (ref 0–149)
VLDL Cholesterol Cal: 30 mg/dL (ref 5–40)

## 2021-02-19 LAB — SEDIMENTATION RATE: Sed Rate: 12 mm/hr (ref 0–30)

## 2021-02-19 LAB — HEMOGLOBIN A1C
Est. average glucose Bld gHb Est-mCnc: 108 mg/dL
Hgb A1c MFr Bld: 5.4 % (ref 4.8–5.6)

## 2021-02-19 LAB — CYCLIC CITRUL PEPTIDE ANTIBODY, IGG/IGA: Cyclic Citrullin Peptide Ab: 4 units (ref 0–19)

## 2021-02-19 MED ORDER — AMLODIPINE BESYLATE 5 MG PO TABS: 5.0000 mg | ORAL_TABLET | Freq: Every day | ORAL | 3 refills | Status: DC

## 2021-02-19 MED ORDER — TAMSULOSIN HCL 0.4 MG PO CAPS: 0.4000 mg | ORAL_CAPSULE | Freq: Every day | ORAL | 3 refills | Status: DC

## 2021-02-19 MED ORDER — VALSARTAN-HYDROCHLOROTHIAZIDE 160-25 MG PO TABS: ORAL_TABLET | ORAL | 3 refills | Status: DC

## 2021-02-19 NOTE — Progress Notes (Signed)
Left a message for pt to look at his mychart

## 2021-02-21 ENCOUNTER — Encounter: Payer: Self-pay | Admitting: Internal Medicine

## 2021-02-22 ENCOUNTER — Other Ambulatory Visit: Payer: Self-pay | Admitting: Medical

## 2021-02-22 DIAGNOSIS — I10 Essential (primary) hypertension: Secondary | ICD-10-CM

## 2021-02-22 DIAGNOSIS — R935 Abnormal findings on diagnostic imaging of other abdominal regions, including retroperitoneum: Secondary | ICD-10-CM

## 2021-02-22 DIAGNOSIS — N1831 Chronic kidney disease, stage 3a: Secondary | ICD-10-CM

## 2021-02-22 NOTE — Progress Notes (Signed)
cardio

## 2021-05-21 ENCOUNTER — Encounter: Payer: Self-pay | Admitting: Internal Medicine

## 2021-05-24 ENCOUNTER — Ambulatory Visit (INDEPENDENT_AMBULATORY_CARE_PROVIDER_SITE_OTHER): Payer: Medicare Other | Admitting: Family Medicine

## 2021-05-24 ENCOUNTER — Encounter: Payer: Self-pay | Admitting: Cardiology

## 2021-05-24 ENCOUNTER — Ambulatory Visit (INDEPENDENT_AMBULATORY_CARE_PROVIDER_SITE_OTHER): Payer: Medicare Other | Admitting: Cardiology

## 2021-05-24 ENCOUNTER — Other Ambulatory Visit: Payer: Self-pay

## 2021-05-24 ENCOUNTER — Encounter: Payer: Self-pay | Admitting: Family Medicine

## 2021-05-24 VITALS — BP 124/74 | HR 85 | Ht 69.5 in | Wt 205.0 lb

## 2021-05-24 VITALS — BP 124/80 | HR 58 | Temp 97.8°F | Wt 203.8 lb

## 2021-05-24 DIAGNOSIS — I493 Ventricular premature depolarization: Secondary | ICD-10-CM | POA: Insufficient documentation

## 2021-05-24 DIAGNOSIS — I119 Hypertensive heart disease without heart failure: Secondary | ICD-10-CM | POA: Diagnosis not present

## 2021-05-24 DIAGNOSIS — Z136 Encounter for screening for cardiovascular disorders: Secondary | ICD-10-CM | POA: Diagnosis not present

## 2021-05-24 DIAGNOSIS — R0602 Shortness of breath: Secondary | ICD-10-CM | POA: Diagnosis not present

## 2021-05-24 DIAGNOSIS — R0789 Other chest pain: Secondary | ICD-10-CM | POA: Insufficient documentation

## 2021-05-24 DIAGNOSIS — H9193 Unspecified hearing loss, bilateral: Secondary | ICD-10-CM

## 2021-05-24 DIAGNOSIS — I10 Essential (primary) hypertension: Secondary | ICD-10-CM

## 2021-05-24 DIAGNOSIS — H7393 Unspecified disorder of tympanic membrane, bilateral: Secondary | ICD-10-CM

## 2021-05-24 NOTE — Patient Instructions (Signed)
Medication Instructions:  The current medical regimen is effective;  continue present plan and medications.  *If you need a refill on your cardiac medications before your next appointment, please call your pharmacy*  Testing/Procedures: Your physician has requested that you have an echocardiogram. Echocardiography is a painless test that uses sound waves to create images of your heart. It provides your doctor with information about the size and shape of your heart and how well your heart's chambers and valves are working. This procedure takes approximately one hour. There are no restrictions for this procedure.  Coronary Calcium Score is completed by CT scanning, which is a noninvasive, special x-ray that produces cross-sectional images of the body using x-rays and a computer. CT scans help physicians diagnose and treat medical conditions. For some CT exams, a contrast material is used to enhance visibility in the area of the body being studied. CT scans provide greater clarity and reveal more details than regular x-ray exams.  This test will be completed here [address] , [place], [place].  You may eat and drink as normal this day.  Follow-Up: At [facility], you and your health needs are our priority.  As part of our continuing mission to provide you with exceptional heart care, we have created designated Provider Care Teams.  These Care Teams include your primary Cardiologist (physician) and Advanced Practice Providers (APPs -  Physician Assistants and Nurse Practitioners) who all work together to provide you with the care you need, when you need it.  We recommend signing up for the patient portal called "MyChart".  Sign up information is provided on this After Visit Summary.  MyChart is used to connect with patients for Virtual Visits (Telemedicine).  Patients are able to view lab/test results, encounter notes, upcoming appointments, etc.  Non-urgent messages can be sent to your  provider as well.   To learn more about what you can do with MyChart, go to [email or web address].    Your next appointment:   Follow up as needed.  Thank you for choosing [facility]!!

## 2021-05-24 NOTE — Assessment & Plan Note (Addendum)
Likely musculoskeletal.  GERD.  We will keep an eye on this.  Checking a coronary calcium score.  This will help determine whether or not we should trial statin therapy.

## 2021-05-24 NOTE — Assessment & Plan Note (Signed)
2 PVCs seen on ECG.  No higher symptoms such as syncope.  Checking echocardiogram.

## 2021-05-24 NOTE — Progress Notes (Signed)
   Subjective:    Patient ID: Peter Russell, male    DOB: 30-Nov-1945, 75 y.o.   MRN: [id number]  HPI He is here for consult concerning hearing loss.  He has had difficulty with cerumen impactions in the past and was also evaluated several years ago and told that he needed a hearing aid.  He now states that it is becoming more bothersome.   Review of Systems     Objective:   Physical Exam Alert and in no distress.  Both canals are normal however both tympanic membranes have lost their normal architecture.  They are slightly dull. 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  He was comfortable with that.

## 2021-05-24 NOTE — Progress Notes (Signed)
Cardiology Office Note:    Date:  05/24/2021   ID:  Peter Russell, DOB Oct 15, 1945, MRN [id number]  PCP:  Carlena Hurl, PA-C   [facility] Providers Cardiologist:  Candee Furbish, MD     Referring MD: Carlena Hurl, PA-C   History of Present Illness:    Peter Russell is a 75 y.o. male here for the evaluation of abnormal CT scan at the request of Chana Bode, [place].  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  Hemoglobin A1c 5.4.  Creatinine 1.0 hemoglobin 14 potassium 3.9 ALT 9.  He states that occasionally he will have shortness of breath after traversing 20 steps.  He has nonexertional chest discomfort.  He has been told in the past that this may be GERD or partial metastasis from a prostate cancer on his sternum.  Overall it is nonexertional type pain.  Past Medical History:  Diagnosis Date   Arthritis    sternum and knees   CKD (chronic kidney disease) stage 3, GFR 30-59 ml/min (HCC) 2020   Hypertension    Prostate cancer ([place])     Past Surgical History:  Procedure Laterality Date   APPENDECTOMY     NASAL SINUS SURGERY     PROSTATE CRYOABLATION      Current Medications: Current Meds  Medication Sig   amLODipine (NORVASC) 5 MG tablet Take 1 tablet (5 mg total) by mouth daily.   ascorbic Acid (VITAMIN C) 500 MG CPCR Take by mouth.   Coenzyme Q10 100 MG capsule Take by mouth.   cyanocobalamin 1000 MCG tablet Take by mouth.   Garlic 10 MG CAPS Take 7,628 mg by mouth.   MAGNESIUM PO Take by mouth.   Omega-3 Fatty Acids (FISH OIL) 1000 MG CAPS Take by mouth.   Prednisol Ace-Moxiflox-Bromfen 1-0.5-0.075 % SUSP Place 1 drop into the left eye 4 (four) times daily.   Probiotic Product (PROBIOTIC DAILY PO) Take by mouth.   tamsulosin (FLOMAX) 0.4 MG CAPS capsule Take 1 capsule (0.4 mg total) by mouth daily.    TURMERIC PO Take by mouth.   valsartan-hydrochlorothiazide (DIOVAN-HCT) 160-25 MG tablet One tablet daily   vitamin E 180 MG (400 UNITS) capsule Take by mouth.   XTANDI 40 MG capsule Take 160 mg by mouth daily.     Allergies:   Patient has no known allergies.   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  Has children.  Was living in [place] prior, moved to [place] fall 2020.   As of  02/2021   Social Determinants of Health   Financial Resource Strain: Not on file  Food Insecurity: Not on file  Transportation Needs: Not on file  Physical Activity: Not on file  Stress: Not on file  Social Connections: Not on file     Family History: The patient's family history includes Alcohol abuse in his father; Cancer in his brother; Diabetes in his father.  ROS:   Please see the history of present illness.    No fevers chills nausea vomiting syncope bleeding all other systems reviewed and are negative.  EKGs/Labs/Other Studies Reviewed:    The following studies were reviewed today: Prior office notes reviewed, EKG, labs  EKG:  EKG is  ordered today.  The ekg ordered today demonstrates sinus rhythm 85 with 2 PVCs nonspecific ST-T wave changes  Recent Labs: 02/15/2021: ALT 11; BUN 22; Creatinine, Ser 1.07; Hemoglobin 14.0; Platelets 290; Potassium 3.6; Sodium 141  Recent Lipid Panel    Component Value  Date/Time   CHOL 228 (H) 02/15/2021 0905   TRIG 167 (H) 02/15/2021 0905   HDL 53 02/15/2021 0905   CHOLHDL 4.3 02/15/2021 0905   LDLCALC 145 (H) 02/15/2021 0905     Risk Assessment/Calculations:          Physical Exam:    VS:  BP 124/74 (BP Location: Left Arm, Patient Position: Sitting, Cuff Size: Normal)   Pulse 85   Ht 5' 9.5" (1.765 m)   Wt 205 lb (93 kg)   SpO2 98%   BMI 29.84 kg/m     Wt Readings from Last 3 Encounters:  05/24/21 205 lb (93 kg)  05/24/21 203 lb 12.8 oz (92.4 kg)  02/15/21 199 lb 9.6 oz (90.5 kg)     GEN:  Well nourished, well developed in no acute distress HEENT: Normal NECK: No JVD; No carotid bruits LYMPHATICS: No lymphadenopathy CARDIAC: RRR, no murmurs, rubs, gallops RESPIRATORY:  Clear to auscultation without rales, wheezing or rhonchi  ABDOMEN: Soft, non-tender, non-distended MUSCULOSKELETAL:  No edema; No deformity  SKIN: Warm and dry NEUROLOGIC:  Alert and oriented x 3 PSYCHIATRIC:  Normal affect   ASSESSMENT:    1. Essential hypertension, benign   2. Shortness of breath   3. Screening for cardiovascular condition   4. Screening for heart disease   5. Hypertensive heart disease without heart failure     PLAN:    In order of problems listed above:  Shortness of breath Checking echocardiogram.  States that he had heart murmur as a child.  I do not hear any murmur today.  Does have some shortness of breath with exertional activity.  PVCs (premature ventricular contractions) 2 PVCs seen on ECG.  No higher symptoms such as syncope.  Checking echocardiogram.  Atypical chest pain Likely musculoskeletal.  GERD.  We will keep an eye on this.  Checking a coronary calcium score.  This will help determine whether or not we should trial statin therapy.    Medication Adjustments/Labs and Tests Ordered: Current medicines are reviewed at length with the patient today.  Concerns regarding medicines are outlined above.  Orders Placed  This Encounter  Procedures   CT CARDIAC SCORING (SELF PAY ONLY)   EKG 12-Lead   ECHOCARDIOGRAM COMPLETE    No orders of the defined types were placed in this encounter.   Patient Instructions  Medication Instructions:  The current medical regimen is effective;  continue present plan and medications.  *If you need a refill on your cardiac medications before your next appointment, please call your pharmacy*  Testing/Procedures: Your physician has requested that you have an echocardiogram. Echocardiography is a painless test  that uses sound waves to create images of your heart. It provides your doctor with information about the size and shape of your heart and how well your heart's chambers and valves are working. This procedure takes approximately one hour. There are no restrictions for this procedure.  Coronary Calcium Score is completed by CT scanning, which is a noninvasive, special x-ray that produces cross-sectional images of the body using x-rays and a computer. CT scans help physicians diagnose and treat medical conditions. For some CT exams, a contrast material is used to enhance visibility in the area of the body being studied. CT scans provide greater clarity and reveal more details than regular x-ray exams.  This test will be completed here [address] , [place], [place].  You may eat and drink as normal this day.  Follow-Up: At [facility], you and your health needs are our priority.  As part of our continuing mission to provide you with exceptional heart care, we have created designated Provider Care Teams.  These Care Teams include your primary Cardiologist (physician) and Advanced Practice Providers (APPs -  Physician Assistants and Nurse Practitioners) who all work together to provide you with the care you need, when you need it.  We recommend signing up for the patient portal called "MyChart".  Sign up information is provided on this After Visit Summary.  MyChart  is used to connect with patients for Virtual Visits (Telemedicine).  Patients are able to view lab/test results, encounter notes, upcoming appointments, etc.  Non-urgent messages can be sent to your provider as well.   To learn more about what you can do with MyChart, go to [email or web address].    Your next appointment:   Follow up as needed.  Thank you for choosing [facility]!!     Signed, Candee Furbish, MD  05/24/2021 4:12 PM    [facility]

## 2021-05-24 NOTE — Assessment & Plan Note (Signed)
Checking echocardiogram.  States that he had heart murmur as a child.  I do not hear any murmur today.  Does have some shortness of breath with exertional activity.

## 2021-06-20 ENCOUNTER — Other Ambulatory Visit: Payer: Self-pay

## 2021-06-20 ENCOUNTER — Ambulatory Visit (INDEPENDENT_AMBULATORY_CARE_PROVIDER_SITE_OTHER)
Admission: RE | Admit: 2021-06-20 | Discharge: 2021-06-20 | Disposition: A | Discharge status: Home / Self Care | Payer: Self-pay | Source: Ambulatory Visit | Attending: Cardiology | Admitting: Cardiology

## 2021-06-20 ENCOUNTER — Ambulatory Visit (HOSPITAL_COMMUNITY): Payer: Medicare Other | Attending: Cardiology

## 2021-06-20 DIAGNOSIS — R0602 Shortness of breath: Secondary | ICD-10-CM

## 2021-06-20 DIAGNOSIS — I10 Essential (primary) hypertension: Secondary | ICD-10-CM | POA: Diagnosis present

## 2021-06-20 DIAGNOSIS — I119 Hypertensive heart disease without heart failure: Secondary | ICD-10-CM

## 2021-06-20 DIAGNOSIS — Z136 Encounter for screening for cardiovascular disorders: Secondary | ICD-10-CM

## 2021-06-20 LAB — ECHOCARDIOGRAM COMPLETE
Area-P 1/2: 3.21 cm2
S' Lateral: 3 cm

## 2021-06-20 MED ORDER — PERFLUTREN LIPID MICROSPHERE
1.0000 mL | INTRAVENOUS | Status: AC | PRN
Start: 2021-06-20 — End: 2021-06-20
  Administered 2021-06-20: 3 mL via INTRAVENOUS

## 2021-07-08 ENCOUNTER — Encounter: Payer: Self-pay | Admitting: *Deleted

## 2021-07-22 ENCOUNTER — Encounter: Payer: Self-pay | Admitting: Cardiology

## 2021-07-22 ENCOUNTER — Other Ambulatory Visit: Payer: Self-pay | Admitting: *Deleted

## 2021-07-22 DIAGNOSIS — R931 Abnormal findings on diagnostic imaging of heart and coronary circulation: Secondary | ICD-10-CM

## 2021-07-22 DIAGNOSIS — Z79899 Other long term (current) drug therapy: Secondary | ICD-10-CM

## 2021-07-22 MED ORDER — ROSUVASTATIN CALCIUM 10 MG PO TABS: 10.0000 mg | ORAL_TABLET | Freq: Every day | ORAL | 3 refills | Status: DC

## 2021-08-23 ENCOUNTER — Telehealth: Payer: Self-pay | Admitting: Medical

## 2021-08-23 NOTE — Telephone Encounter (Signed)
Please call patient.  I wanted to make sure he knew I was aware of his recent urology visit.  I hope he is tolerating treatment ok.  I was concerned about how he was doing.

## 2021-09-26 ENCOUNTER — Encounter: Payer: Self-pay | Admitting: Internal Medicine

## 2021-10-03 ENCOUNTER — Encounter: Payer: Self-pay | Admitting: Internal Medicine

## 2021-10-31 ENCOUNTER — Encounter: Payer: Medicare Other | Admitting: Family Medicine

## 2021-11-13 ENCOUNTER — Other Ambulatory Visit (HOSPITAL_COMMUNITY): Payer: Self-pay | Admitting: Urology

## 2021-11-13 ENCOUNTER — Other Ambulatory Visit: Payer: Self-pay | Admitting: Urology

## 2021-11-13 DIAGNOSIS — C7951 Secondary malignant neoplasm of bone: Secondary | ICD-10-CM

## 2021-11-25 ENCOUNTER — Encounter (HOSPITAL_COMMUNITY)
Admission: RE | Admit: 2021-11-25 | Discharge: 2021-11-25 | Disposition: A | Discharge status: Home / Self Care | Payer: Medicare Other | Source: Ambulatory Visit | Attending: Urology | Admitting: Urology

## 2021-11-25 ENCOUNTER — Ambulatory Visit (HOSPITAL_COMMUNITY)
Admission: RE | Admit: 2021-11-25 | Discharge: 2021-11-25 | Disposition: A | Discharge status: Home / Self Care | Payer: Medicare Other | Source: Ambulatory Visit | Attending: Urology | Admitting: Urology

## 2021-11-25 DIAGNOSIS — C7951 Secondary malignant neoplasm of bone: Secondary | ICD-10-CM | POA: Diagnosis present

## 2021-11-25 MED ORDER — TECHNETIUM TC 99M MEDRONATE IV KIT
20.0000 | PACK | Freq: Once | INTRAVENOUS | Status: AC | PRN
  Administered 2021-11-25: 20 via INTRAVENOUS

## 2022-02-19 ENCOUNTER — Telehealth: Payer: Self-pay | Admitting: Medical

## 2022-02-19 NOTE — Telephone Encounter (Signed)
Left message for patient to call back and schedule Medicare Annual Wellness Visit (AWV) either virtually or in office. I left my number for patient to call [phone].  Last AWV 02/15/21  please schedule at anytime with health coach

## 2022-02-24 NOTE — Telephone Encounter (Signed)
Returned patients call   Patient returned my call from 7/14

## 2022-02-26 ENCOUNTER — Other Ambulatory Visit: Payer: Self-pay | Admitting: Medical

## 2022-02-28 ENCOUNTER — Ambulatory Visit (INDEPENDENT_AMBULATORY_CARE_PROVIDER_SITE_OTHER): Payer: Medicare Other

## 2022-02-28 VITALS — BP 130/82 | HR 71 | Temp 97.5°F | Ht 69.5 in | Wt 206.6 lb

## 2022-02-28 DIAGNOSIS — Z0000 Encounter for general adult medical examination without abnormal findings: Secondary | ICD-10-CM

## 2022-02-28 NOTE — Progress Notes (Signed)
Subjective:   Peter Russell is a 76 y.o. male who presents for Medicare Annual/Subsequent preventive examination.  Review of Systems     Cardiac Risk Factors include: advanced age (>79mn, >>54women);hypertension;male gender;obesity (BMI >30kg/m2)     Objective:    Today's Vitals   02/28/22 1327  BP: 130/82  Pulse: 71  Temp: (!) 97.5 F (36.4 C)  TempSrc: Oral  SpO2: 97%  Weight: 206 lb 9.6 oz (93.7 kg)  Height: 5' 9.5" (1.765 m)   Body mass index is 30.07 kg/m.     02/28/2022    1:44 PM 02/02/2020   10:08 AM  Advanced Directives  Does Patient Have a Medical Advance Directive? No No  Would patient like information on creating a medical advance directive? No - Patient declined     Current Medications (verified) Outpatient Encounter Medications as of 02/28/2022  Medication Sig   amLODipine (NORVASC) 5 MG tablet Take 1 tablet (5 mg total) by mouth daily.   ascorbic Acid (VITAMIN C) 500 MG CPCR Take by mouth.   Coenzyme Q10 100 MG capsule Take by mouth.   cyanocobalamin 1000 MCG tablet Take by mouth.   Garlic 10 MG CAPS Take 12,353mg by mouth.   MAGNESIUM PO Take by mouth.   Omega-3 Fatty Acids (FISH OIL) 1000 MG CAPS Take by mouth.   Probiotic Product (PROBIOTIC DAILY PO) Take by mouth.   rosuvastatin (CRESTOR) 10 MG tablet Take 1 tablet (10 mg total) by mouth daily.   tamsulosin (FLOMAX) 0.4 MG CAPS capsule Take 1 capsule (0.4 mg total) by mouth daily.   TURMERIC PO Take by mouth.   valsartan-hydrochlorothiazide (DIOVAN-HCT) 160-25 MG tablet Take 1 tablet by mouth once daily   vitamin E 180 MG (400 UNITS) capsule Take by mouth.   XTANDI 40 MG capsule Take 160 mg by mouth daily.   Prednisol Ace-Moxiflox-Bromfen 1-0.5-0.075 % SUSP Place 1 drop into the left eye 4 (four) times daily. (Patient not taking: Reported on 02/28/2022)   No facility-administered encounter medications on file as of 02/28/2022.    Allergies (verified) Patient has no known allergies.    History: Past Medical History:  Diagnosis Date   Arthritis    sternum and knees   CKD (chronic kidney disease) stage 3, GFR 30-59 ml/min (HCC) 2020   Hypertension    Prostate cancer ([facility]    Past Surgical History:  Procedure Laterality Date   APPENDECTOMY     cataract surgery Bilateral    06/2021, 08/2021   NASAL SINUS SURGERY     PROSTATE CRYOABLATION     Family History  Problem Relation Age of Onset   Alcohol abuse Father    Diabetes Father    Cancer Brother        prostate   Social History   Socioeconomic History   Marital status: Married    Spouse name: Not on file   Number of children: Not on file   Years of education: Not on file   Highest education level: Not on file  Occupational History   Not on file  Tobacco Use   Smoking status: Former   Smokeless tobacco: Never  Vaping Use   Vaping Use: Never used  Substance and Sexual Activity   Alcohol use: Never   Drug use: Never   Sexual activity: Not on file  Other Topics Concern   Not on file  Social History Narrative   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  Has children.  Was living in [place] prior, moved to [place] fall 2020.   As of  02/2021   Social Determinants of Health   Financial Resource Strain: Low Risk  (02/28/2022)   Overall Financial Resource Strain (CARDIA)    Difficulty of Paying Living Expenses: Not hard at all  Food Insecurity: No Food Insecurity (02/28/2022)   Hunger Vital Sign    Worried About Running Out of Food in the Last Year: Never true    Ran Out of Food in the Last Year: Never true  Transportation Needs: No Transportation Needs (02/28/2022)   PRAPARE - Hydrologist (Medical): No    Lack of Transportation (Non-Medical): No  Physical Activity: Insufficiently Active (02/28/2022)   Exercise Vital Sign    Days of Exercise per Week: 2 days    Minutes of Exercise per Session: 20  min  Stress: No Stress Concern Present (02/28/2022)   [place]    Feeling of Stress : Not at all  Social Connections: Not on file    Tobacco Counseling Counseling given: Not Answered   Clinical Intake:  Pre-visit preparation completed: Yes  Pain : No/denies pain     Nutritional Status: BMI > 30  Obese Nutritional Risks: None Diabetes: No  How often do you need to have someone help you when you read instructions, pamphlets, or other written materials from your doctor or pharmacy?: 1 - Never What is the last grade level you completed in school?: graduate school  Diabetic?no  Interpreter Needed?: No  Information entered by :: NAllen LPN   Activities of Daily Living    02/28/2022    1:48 PM  In your present state of health, do you have any difficulty performing the following activities:  Hearing? 1  Comment seeing the ENT  Vision? 0  Difficulty concentrating or making decisions? 0  Walking or climbing stairs? 0  Dressing or bathing? 0  Doing errands, shopping? 0  Preparing Food and eating ? N  Using the Toilet? N  In the past six months, have you accidently leaked urine? N  Do you have problems with loss of bowel control? N  Managing your Medications? N  Managing your Finances? N  Housekeeping or managing your Housekeeping? N    Patient Care Team: Tysinger, Camelia Eng, PA-C as PCP - General (Family Medicine) Jerline Pain, MD as PCP - Cardiology (Cardiology)  Indicate any recent Medical Services you may have received from other than [facility] providers in the past year (date may be approximate).     Assessment:   This is a routine wellness examination for [name].  Hearing/Vision screen Vision Screening - Comments:: Regular eye exams, [facility]  Dietary issues and exercise activities discussed: Current Exercise Habits: Home exercise routine, Type of exercise: walking, Time (Minutes):  15, Frequency (Times/Week): 2, Weekly Exercise (Minutes/Week): 30   Goals Addressed             This Visit's Progress    Patient Stated       02/28/2022, wants to work on losing weight and exercise       Depression Screen    02/28/2022    1:47 PM 02/15/2021    8:34 AM 02/02/2020   10:08 AM 09/28/2019   11:14 AM  PHQ 2/9 Scores  PHQ - 2 Score 0 0 0 0  PHQ- 9 Score 3  Fall Risk    02/28/2022    1:46 PM 02/15/2021    8:34 AM 02/02/2020   10:08 AM 09/28/2019   11:13 AM  Fall Risk   Falls in the past year? 1 0 0 0  Comment stepped off of the porch     Number falls in past yr: 0 0    Injury with Fall? 0 0    Risk for fall due to : Medication side effect No Fall Risks    Follow up Falls evaluation completed;Education provided;Falls prevention discussed Falls evaluation completed      FALL RISK PREVENTION PERTAINING TO THE HOME:  Any stairs in or around the home? Yes  If so, are there any without handrails? No  Home free of loose throw rugs in walkways, pet beds, electrical cords, etc? Yes  Adequate lighting in your home to reduce risk of falls? Yes   ASSISTIVE DEVICES UTILIZED TO PREVENT FALLS:  Life alert? No  Use of a cane, walker or w/c? No  Grab bars in the bathroom? No  Shower chair or bench in shower? No  Elevated toilet seat or a handicapped toilet? No   TIMED UP AND GO:  Was the test performed? No .     Gait steady and fast without use of assistive device  Cognitive Function:        02/28/2022    1:51 PM  6CIT Screen  What Year? 0 points  What month? 0 points  What time? 0 points  Count back from 20 0 points  Months in reverse 0 points  Repeat phrase 0 points  Total Score 0 points    Immunizations  There is no immunization history on file for this patient.  TDAP status: Due, Education has been provided regarding the importance of this vaccine. Advised may receive this vaccine at local pharmacy or Health Dept. Aware to provide a copy of the  vaccination record if obtained from local pharmacy or Health Dept. Verbalized acceptance and understanding.  Flu Vaccine status: Declined, Education has been provided regarding the importance of this vaccine but patient still declined. Advised may receive this vaccine at local pharmacy or Health Dept. Aware to provide a copy of the vaccination record if obtained from local pharmacy or Health Dept. Verbalized acceptance and understanding.  Pneumococcal vaccine status: Declined,  Education has been provided regarding the importance of this vaccine but patient still declined. Advised may receive this vaccine at local pharmacy or Health Dept. Aware to provide a copy of the vaccination record if obtained from local pharmacy or Health Dept. Verbalized acceptance and understanding.   Covid-19 vaccine status: Declined, Education has been provided regarding the importance of this vaccine but patient still declined. Advised may receive this vaccine at local pharmacy or Health Dept.or vaccine clinic. Aware to provide a copy of the vaccination record if obtained from local pharmacy or Health Dept. Verbalized acceptance and understanding.  Qualifies for Shingles Vaccine? Yes   Zostavax completed No   Shingrix Completed?: No.    Education has been provided regarding the importance of this vaccine. Patient has been advised to call insurance company to determine out of pocket expense if they have not yet received this vaccine. Advised may also receive vaccine at local pharmacy or Health Dept. Verbalized acceptance and understanding.  Screening Tests Health Maintenance  Topic Date Due   COVID-19 Vaccine (1) Never done   Hepatitis C Screening  Never done   Pneumonia Vaccine 55+ Years old (1 - PCV)  Never done   COLONOSCOPY (Pts 45-31yr Insurance coverage will need to be confirmed)  07/17/2022 (Originally 09/16/1990)   TETANUS/TDAP  10/03/2022 (Originally 09/16/1964)   Zoster Vaccines- Shingrix (1 of 2) 12/25/2023  (Originally 09/16/1964)   INFLUENZA VACCINE  03/11/2022   HPV VACCINES  Aged Out    Health Maintenance  Health Maintenance Due  Topic Date Due   COVID-19 Vaccine (1) Never done   Hepatitis C Screening  Never done   Pneumonia Vaccine 74 Years old (1 - PCV) Never done    Colorectal cancer screening: decline   Lung Cancer Screening: (Low Dose CT Chest recommended if Age 673-80years, 30 pack-year currently smoking OR have quit w/in 15years.) does not qualify.   Lung Cancer Screening Referral: no  Additional Screening:  Hepatitis C Screening: does qualify;   Vision Screening: Recommended annual ophthalmology exams for early detection of glaucoma and other disorders of the eye. Is the patient up to date with their annual eye exam?  Yes  Who is the provider or what is the name of the office in which the patient attends annual eye exams? [facility] If pt is not established with a provider, would they like to be referred to a provider to establish care? No .   Dental Screening: Recommended annual dental exams for proper oral hygiene  Community Resource Referral / Chronic Care Management: CRR required this visit?  No   CCM required this visit?  No      Plan:     I have personally reviewed and noted the following in the patient's chart:   Medical and social history Use of alcohol, tobacco or illicit drugs  Current medications and supplements including opioid prescriptions. Patient is not currently taking opioid prescriptions. Functional ability and status Nutritional status Physical activity Advanced directives List of other physicians Hospitalizations, surgeries, and ER visits in previous 12 months Vitals Screenings to include cognitive, depression, and falls Referrals and appointments  In addition, I have reviewed and discussed with patient certain preventive protocols, quality metrics, and best practice recommendations. A written personalized care plan for  preventive services as well as general preventive health recommendations were provided to patient.     NKellie Simmering LPN   [id number]  Nurse Notes: none

## 2022-02-28 NOTE — Patient Instructions (Signed)
Peter Russell , Thank you for taking time to come for your Medicare Wellness Visit. I appreciate your ongoing commitment to your health goals. Please review the following plan we discussed and let me know if I can assist you in the future.   Screening recommendations/referrals: Colonoscopy: decline Recommended yearly ophthalmology/optometry visit for glaucoma screening and checkup Recommended yearly dental visit for hygiene and checkup  Vaccinations: Influenza vaccine: due 03/11/2022 Pneumococcal vaccine: decline Tdap vaccine: decline Shingles vaccine: decline   Covid-19: decline  Advanced directives: working on  Conditions/risks identified: none  Next appointment: Follow up in one year for your annual wellness visit.   Preventive Care 13 Years and Older, Male Preventive care refers to lifestyle choices and visits with your health care provider that can promote health and wellness. What does preventive care include? A yearly physical exam. This is also called an annual well check. Dental exams once or twice a year. Routine eye exams. Ask your health care provider how often you should have your eyes checked. Personal lifestyle choices, including: Daily care of your teeth and gums. Regular physical activity. Eating a healthy diet. Avoiding tobacco and drug use. Limiting alcohol use. Practicing safe sex. Taking low doses of aspirin every day. Taking vitamin and mineral supplements as recommended by your health care provider. What happens during an annual well check? The services and screenings done by your health care provider during your annual well check will depend on your age, overall health, lifestyle risk factors, and family history of disease. Counseling  Your health care provider may ask you questions about your: Alcohol use. Tobacco use. Drug use. Emotional well-being. Home and relationship well-being. Sexual activity. Eating habits. History of falls. Memory and  ability to understand (cognition). Work and work Statistician. Screening  You may have the following tests or measurements: Height, weight, and BMI. Blood pressure. Lipid and cholesterol levels. These may be checked every 5 years, or more frequently if you are over 73 years old. Skin check. Lung cancer screening. You may have this screening every year starting at age 10 if you have a 30-pack-year history of smoking and currently smoke or have quit within the past 15 years. Fecal occult blood test (FOBT) of the stool. You may have this test every year starting at age 70. Flexible sigmoidoscopy or colonoscopy. You may have a sigmoidoscopy every 5 years or a colonoscopy every 10 years starting at age 51. Prostate cancer screening. Recommendations will vary depending on your family history and other risks. Hepatitis C blood test. Hepatitis B blood test. Sexually transmitted disease (STD) testing. Diabetes screening. This is done by checking your blood sugar (glucose) after you have not eaten for a while (fasting). You may have this done every 1-3 years. Abdominal aortic aneurysm (AAA) screening. You may need this if you are a current or former smoker. Osteoporosis. You may be screened starting at age 43 if you are at high risk. Talk with your health care provider about your test results, treatment options, and if necessary, the need for more tests. Vaccines  Your health care provider may recommend certain vaccines, such as: Influenza vaccine. This is recommended every year. Tetanus, diphtheria, and acellular pertussis (Tdap, Td) vaccine. You may need a Td booster every 10 years. Zoster vaccine. You may need this after age 70. Pneumococcal 13-valent conjugate (PCV13) vaccine. One dose is recommended after age 42. Pneumococcal polysaccharide (PPSV23) vaccine. One dose is recommended after age 74. Talk to your health care provider about which screenings and  vaccines you need and how often you need  them. This information is not intended to replace advice given to you by your health care provider. Make sure you discuss any questions you have with your health care provider. Document Released: 08/24/2015 Document Revised: 04/16/2016 Document Reviewed: 05/29/2015 Elsevier Interactive Patient Education  2017 [place] Prevention in the Home Falls can cause injuries. They can happen to people of all ages. There are many things you can do to make your home safe and to help prevent falls. What can I do on the outside of my home? Regularly fix the edges of walkways and driveways and fix any cracks. Remove anything that might make you trip as you walk through a door, such as a raised step or threshold. Trim any bushes or trees on the path to your home. Use bright outdoor lighting. Clear any walking paths of anything that might make someone trip, such as rocks or tools. Regularly check to see if handrails are loose or broken. Make sure that both sides of any steps have handrails. Any raised decks and porches should have guardrails on the edges. Have any leaves, snow, or ice cleared regularly. Use sand or salt on walking paths during winter. Clean up any spills in your garage right away. This includes oil or grease spills. What can I do in the bathroom? Use night lights. Install grab bars by the toilet and in the tub and shower. Do not use towel bars as grab bars. Use non-skid mats or decals in the tub or shower. If you need to sit down in the shower, use a plastic, non-slip stool. Keep the floor dry. Clean up any water that spills on the floor as soon as it happens. Remove soap buildup in the tub or shower regularly. Attach bath mats securely with double-sided non-slip rug tape. Do not have throw rugs and other things on the floor that can make you trip. What can I do in the bedroom? Use night lights. Make sure that you have a light by your bed that is easy to reach. Do not use  any sheets or blankets that are too big for your bed. They should not hang down onto the floor. Have a firm chair that has side arms. You can use this for support while you get dressed. Do not have throw rugs and other things on the floor that can make you trip. What can I do in the kitchen? Clean up any spills right away. Avoid walking on wet floors. Keep items that you use a lot in easy-to-reach places. If you need to reach something above you, use a strong step stool that has a grab bar. Keep electrical cords out of the way. Do not use floor polish or wax that makes floors slippery. If you must use wax, use non-skid floor wax. Do not have throw rugs and other things on the floor that can make you trip. What can I do with my stairs? Do not leave any items on the stairs. Make sure that there are handrails on both sides of the stairs and use them. Fix handrails that are broken or loose. Make sure that handrails are as long as the stairways. Check any carpeting to make sure that it is firmly attached to the stairs. Fix any carpet that is loose or worn. Avoid having throw rugs at the top or bottom of the stairs. If you do have throw rugs, attach them to the floor with carpet tape. Make  sure that you have a light switch at the top of the stairs and the bottom of the stairs. If you do not have them, ask someone to add them for you. What else can I do to help prevent falls? Wear shoes that: Do not have high heels. Have rubber bottoms. Are comfortable and fit you well. Are closed at the toe. Do not wear sandals. If you use a stepladder: Make sure that it is fully opened. Do not climb a closed stepladder. Make sure that both sides of the stepladder are locked into place. Ask someone to hold it for you, if possible. Clearly mark and make sure that you can see: Any grab bars or handrails. First and last steps. Where the edge of each step is. Use tools that help you move around (mobility aids)  if they are needed. These include: Canes. Walkers. Scooters. Crutches. Turn on the lights when you go into a dark area. Replace any light bulbs as soon as they burn out. Set up your furniture so you have a clear path. Avoid moving your furniture around. If any of your floors are uneven, fix them. If there are any pets around you, be aware of where they are. Review your medicines with your doctor. Some medicines can make you feel dizzy. This can increase your chance of falling. Ask your doctor what other things that you can do to help prevent falls. This information is not intended to replace advice given to you by your health care provider. Make sure you discuss any questions you have with your health care provider. Document Released: 05/24/2009 Document Revised: 01/03/2016 Document Reviewed: 09/01/2014 Elsevier Interactive Patient Education  2017 [employer].

## 2022-03-08 ENCOUNTER — Other Ambulatory Visit: Payer: Self-pay | Admitting: Medical

## 2022-04-10 IMAGING — CT CT CARDIAC CORONARY ARTERY CALCIUM SCORE
3 series · 14 of 20 positions shown, 16 images · non-contrast
Comparison: None.
COMPARISON: None.

Addendum:
EXAM:
OVER-READ INTERPRETATION  CT CHEST

The following report is an over-read performed by radiologist Dr.
over-read does not include interpretation of cardiac or coronary
anatomy or pathology. The calcium score interpretation by the
cardiologist is attached.
CLINICAL DATA: Cardiovascular Disease Risk stratification
Coronary Calcium Score
TECHNIQUE: A gated, non-contrast computed tomography scan of the heart was
performed using 3mm slice thickness. Axial images were analyzed on a
dedicated workstation. Calcium scoring of the coronary arteries was
performed using the Agatston method.

[Series 2: cascseq 2.0 sa36 (id) (id) · axial · 0.38mm/px · z∈[-275,-205]mm · 4 of 59 slices shown]
[im 12/59  vessel]
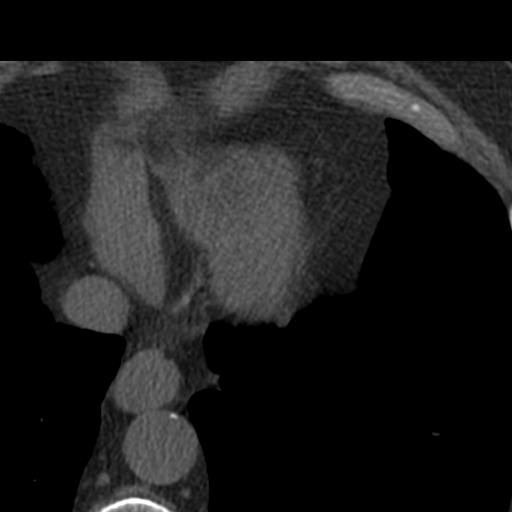
[im 24/59  vessel]
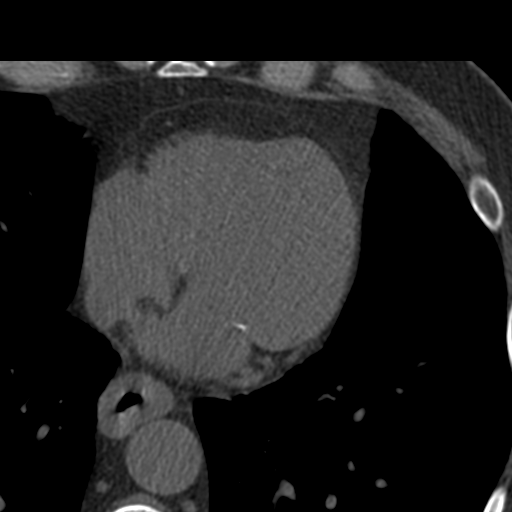
[im 35/59  vessel]
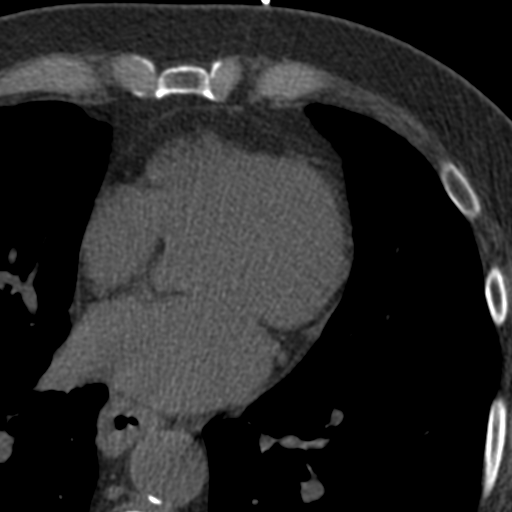
[im 47/59  vessel]
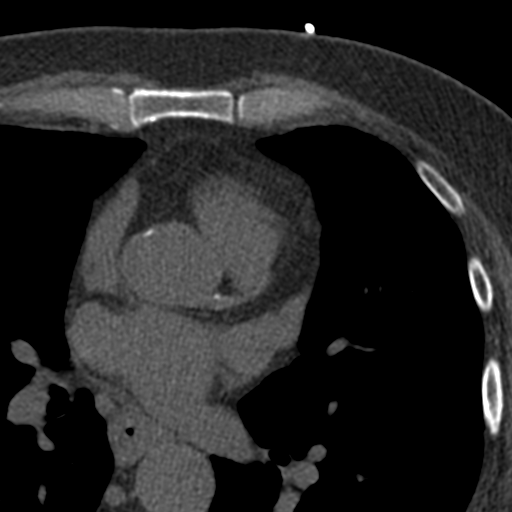

[Series 3: cascseq 2.0 bf37 st · axial · 0.73mm/px · z∈[-279,-201]mm · 5 of 59 slices shown, 7 images]
[im 10/59  vessel]
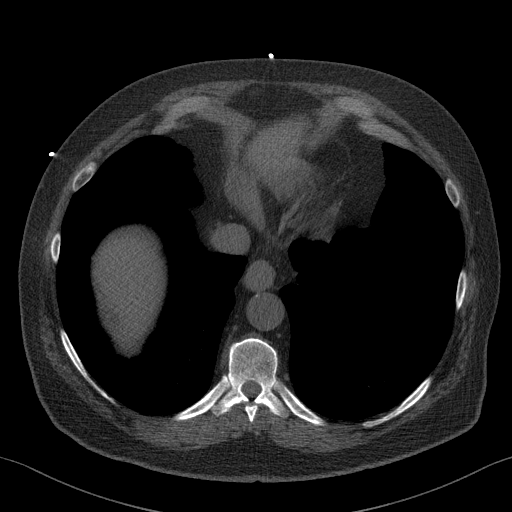
[im 10/59  lung]
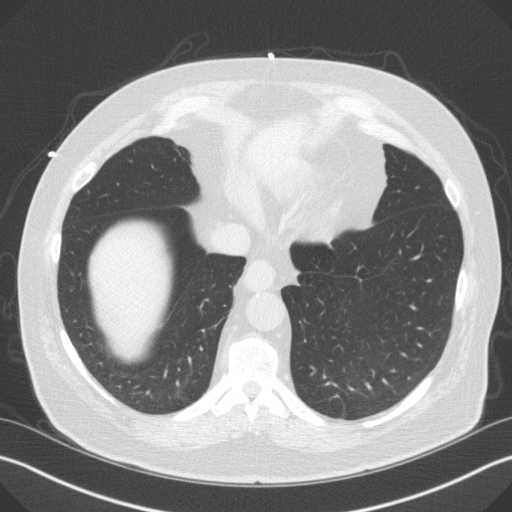
[im 20/59  vessel]
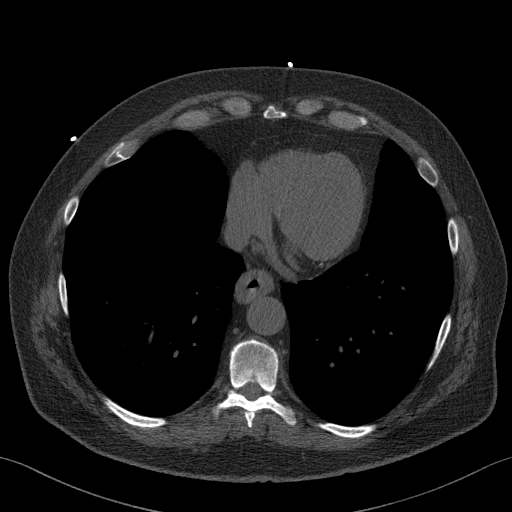
[im 30/59  vessel]
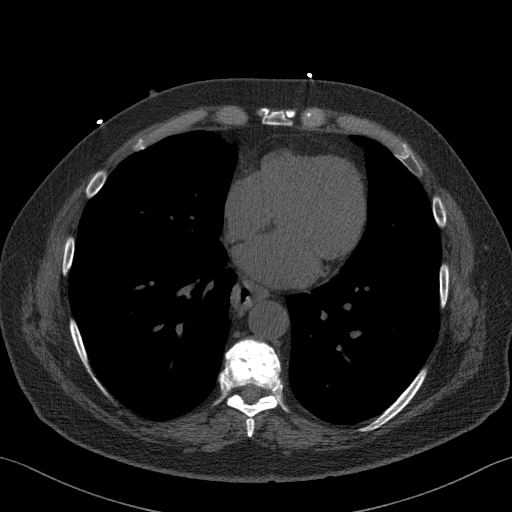
[im 39/59  vessel]
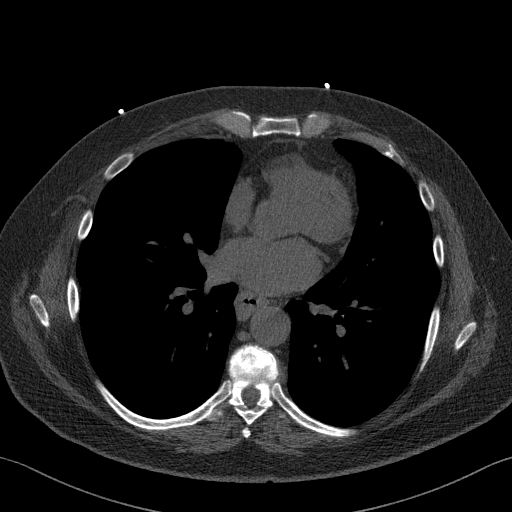
[im 49/59  vessel]
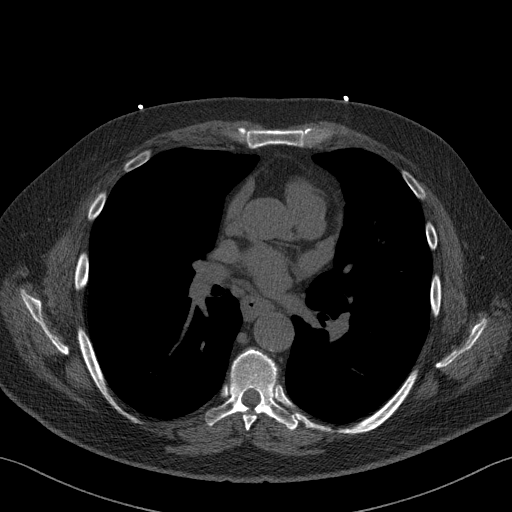
[im 49/59  lung]
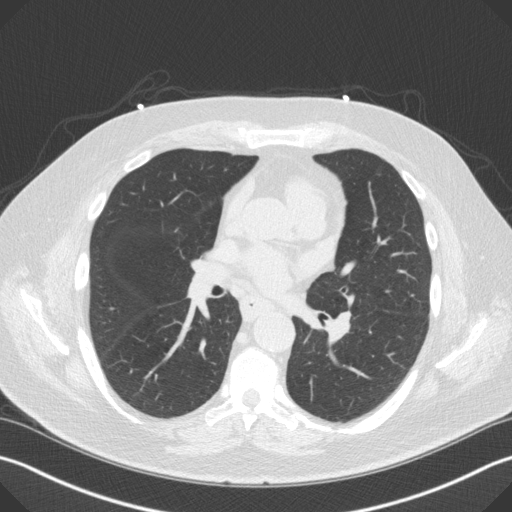

[Series 4: cascseq 2.0 br59 lung · axial · 0.73mm/px · z∈[-279,-201]mm · 5 of 59 slices shown]
[im 10/59  lung]
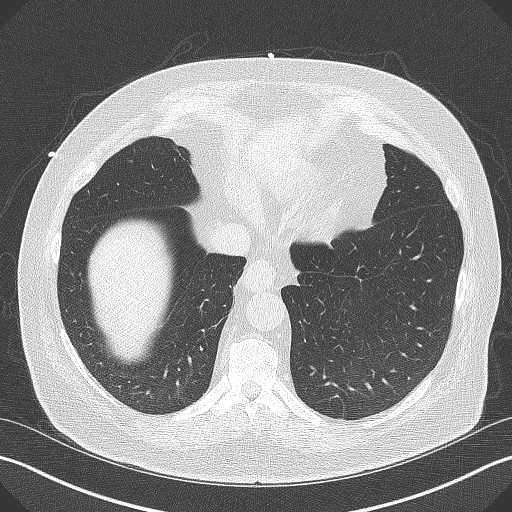
[im 20/59  lung]
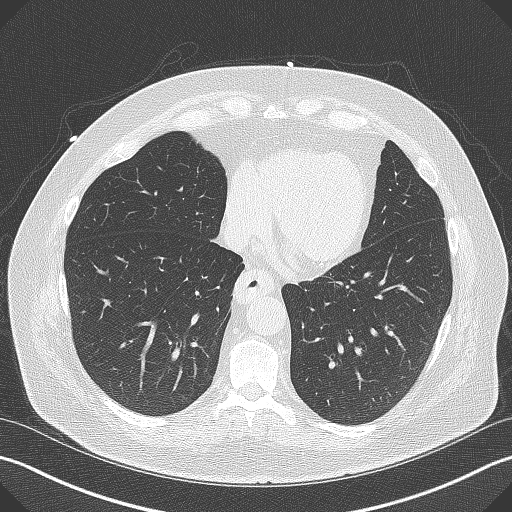
[im 30/59  lung]
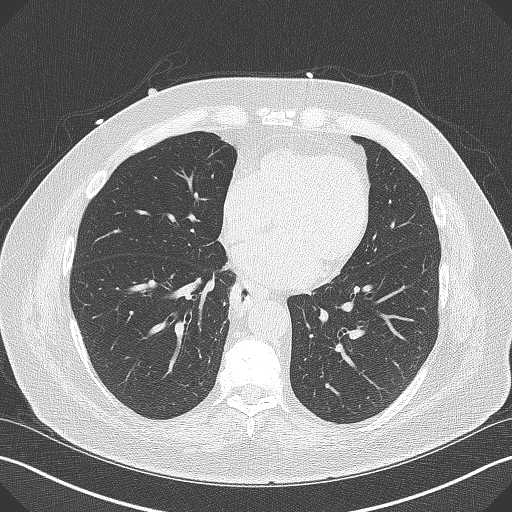
[im 39/59  lung]
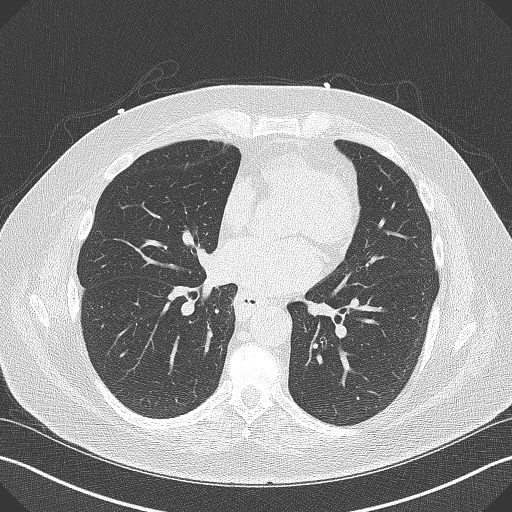
[im 49/59  lung]
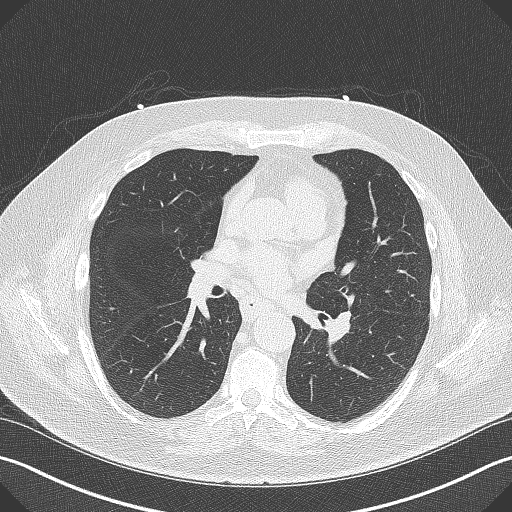

[14 of 20 positions shown; findings below may reference images not displayed]

FINDINGS: Vascular: Aortic atherosclerosis.

Mediastinum/Nodes: No imaged thoracic adenopathy. Mild to moderate
distal distal esophageal wall thickening (38/3).

Lungs/Pleura: No pleural fluid.  Clear imaged lungs.

Upper Abdomen: Normal imaged portions of the liver, spleen, stomach.

Musculoskeletal: Heterogeneous sclerosis involves a midthoracic
vertebral body, including on sagittal image 85.
IMPRESSION: 1. Sclerotic lesion involving a thoracic vertebral body. When
correlated with the bone scan of 11/30/2019, this likely represents
metastatic disease from patient's known prostate cancer.
2.  Aortic Atherosclerosis (XU31W-EN4.4).
3. Distal esophageal wall thickening, suspicious for esophagitis.
FINDINGS: Coronary arteries: Normal origins.

Coronary Calcium Score:

Left main: 9

Left anterior descending artery: 0

Left circumflex artery: 74

Right coronary artery: 0

Total: 83

Percentile: 35

Pericardium: Normal.

Ascending Aorta: Normal caliber.

Non-cardiac: See separate report from [REDACTED].
IMPRESSION: Coronary calcium score of 83. This was 35th percentile for age-,
race-, and sex-matched controls.



If CAC=0, it is reasonable to withhold statin therapy and reassess
in 5 to 10 years, as long as higher risk conditions are absent
(diabetes mellitus, family history of premature CHD in first degree
relatives (males <55 years; females <65 years), cigarette smoking,
or LDL >=190 mg/dL).

If CAC is 1 to 99, it is reasonable to initiate statin therapy for
patients >=55 years of age.

If CAC is >=100 or >=75th percentile, it is reasonable to initiate
statin therapy at any age.

Cardiology referral should be considered for patients with CAC
scores >=400 or >=75th percentile.

*7960 AHA/ACC/AACVPR/AAPA/ABC/ALEY/HRENOV/BARTHELUS/Maus/MINO/POLIN/KLUGE
Guideline on the Management of Blood Cholesterol: A Report of the
American College of Cardiology/American Heart Association Task Force
on Clinical Practice Guidelines. J Am Coll Cardiol.
0147;73(24):0021-0325.

*** End of Addendum ***
EXAM:
OVER-READ INTERPRETATION  CT CHEST

The following report is an over-read performed by radiologist Dr.
over-read does not include interpretation of cardiac or coronary
anatomy or pathology. The calcium score interpretation by the
cardiologist is attached.
FINDINGS: Vascular: Aortic atherosclerosis.

Mediastinum/Nodes: No imaged thoracic adenopathy. Mild to moderate
distal distal esophageal wall thickening (38/3).

Lungs/Pleura: No pleural fluid.  Clear imaged lungs.

Upper Abdomen: Normal imaged portions of the liver, spleen, stomach.

Musculoskeletal: Heterogeneous sclerosis involves a midthoracic
vertebral body, including on sagittal image 85.
IMPRESSION: 1. Sclerotic lesion involving a thoracic vertebral body. When
correlated with the bone scan of 11/30/2019, this likely represents
metastatic disease from patient's known prostate cancer.
2.  Aortic Atherosclerosis (XU31W-EN4.4).
3. Distal esophageal wall thickening, suspicious for esophagitis.

## 2022-04-16 ENCOUNTER — Encounter: Payer: Self-pay | Admitting: Internal Medicine

## 2022-05-06 ENCOUNTER — Telehealth: Payer: Self-pay | Admitting: Cardiology

## 2022-05-06 DIAGNOSIS — I10 Essential (primary) hypertension: Secondary | ICD-10-CM

## 2022-05-06 DIAGNOSIS — R931 Abnormal findings on diagnostic imaging of heart and coronary circulation: Secondary | ICD-10-CM

## 2022-05-06 DIAGNOSIS — Z79899 Other long term (current) drug therapy: Secondary | ICD-10-CM

## 2022-05-06 NOTE — Telephone Encounter (Signed)
Will have Dr Marlou Porch to review for any necessary orders prior to his November appt.

## 2022-05-06 NOTE — Telephone Encounter (Signed)
Pt called [facility] wanting to know if Dr. Marlou Porch needed any lab work drawn prior to his 07/08/22 visit.  He is taking rosuvastatin.  Pt was told that Dr. Marlou Porch may order labs during his visit to [facility], and have blood drawn after the visit to assess the effectiveness of the medication.  Dr. Marlou Porch and team would adjust his POC, depending on what the lab results reveal.    I checked patients last visit with Dr. Marlou Porch, and other messages, and no pre-lab orders were found.  Pt will see Dr. Marlou Porch 07/08/22.

## 2022-05-06 NOTE — Telephone Encounter (Signed)
Orders placed for future labs as ordered.  Left message for pt to call back to schedule lab at his convenience.

## 2022-05-06 NOTE — Telephone Encounter (Signed)
Pt would like to know if he needs to have any labs or test done before f/u visit on 11/28. Please advise

## 2022-05-09 NOTE — Telephone Encounter (Signed)
Lab has been scheduled and linked for 11/22

## 2022-05-20 ENCOUNTER — Encounter: Payer: Self-pay | Admitting: Internal Medicine

## 2022-05-29 ENCOUNTER — Other Ambulatory Visit: Payer: Self-pay | Admitting: Medical

## 2022-06-10 ENCOUNTER — Other Ambulatory Visit: Payer: Self-pay | Admitting: Medical

## 2022-06-17 ENCOUNTER — Ambulatory Visit (INDEPENDENT_AMBULATORY_CARE_PROVIDER_SITE_OTHER): Payer: Medicare Other | Admitting: Medical

## 2022-06-17 VITALS — BP 130/82 | HR 77 | Wt 210.8 lb

## 2022-06-17 DIAGNOSIS — I10 Essential (primary) hypertension: Secondary | ICD-10-CM

## 2022-06-17 DIAGNOSIS — R0602 Shortness of breath: Secondary | ICD-10-CM

## 2022-06-17 DIAGNOSIS — Z1159 Encounter for screening for other viral diseases: Secondary | ICD-10-CM

## 2022-06-17 DIAGNOSIS — N1831 Chronic kidney disease, stage 3a: Secondary | ICD-10-CM

## 2022-06-17 DIAGNOSIS — R7309 Other abnormal glucose: Secondary | ICD-10-CM

## 2022-06-17 DIAGNOSIS — M2550 Pain in unspecified joint: Secondary | ICD-10-CM

## 2022-06-17 DIAGNOSIS — Z7185 Encounter for immunization safety counseling: Secondary | ICD-10-CM

## 2022-06-17 DIAGNOSIS — Z2820 Immunization not carried out because of patient decision for unspecified reason: Secondary | ICD-10-CM | POA: Diagnosis not present

## 2022-06-17 DIAGNOSIS — R350 Frequency of micturition: Secondary | ICD-10-CM

## 2022-06-17 DIAGNOSIS — Z8546 Personal history of malignant neoplasm of prostate: Secondary | ICD-10-CM

## 2022-06-17 NOTE — Progress Notes (Signed)
Subjective: Chief Complaint  Patient presents with   med check    Med check, declines any vaccines   Here for med check  Patient Care Team: Maelyn Berrey, Camelia Eng, PA-C as PCP - General (Family Medicine) Jerline Pain, MD as PCP - Cardiology (Cardiology) Alexis Frock, MD as Consulting Physician (Urology) Raylene Miyamoto, MD as Referring Physician (Otolaryngology) Dentist, Dr. Oneida Alar doctor,- needs one   Getting indigestion and heartburn from time to time, but worse at night sometimes.   Urology suggested famotidine.  Uses this some.  Wants to make sure the famotidine can be used regularly if he decides to do so.  Seeing urology regularly, saw them recently and is continuing on testosterone blocker, in full remission currently.  PSAs have been negligible.  Has hx/o prostate cancer, had 3 prior ablations of prostate tissue.    Seeing cardiology soon, Dr. Marlou Porch, started on statin a year ago.   He notes finding of aortic atherosclerosis on scan last year.  Saw ENT last year due to hearing loss, ear infection and wax issues.  HTN - compliant with amlodipine and valsartan HCT.  Gets up 3-4 times per night to urinate, still takes Flomax regularly.  He rejects any vaccines.  Nonsmoker since 1984.  OSA - uses CPAP regularly  Chart notes hx/o RA, but he is not sure about the diagnosis.  He does get stiff and achy at times.    He notes some morning stiffness, but not for an hour or significant.  Hardest issues is standing up out of the car.  ROS as in subjective   Past Medical History:  Diagnosis Date   Arthritis    sternum and knees   CKD (chronic kidney disease) stage 3, GFR 30-59 ml/min (HCC) 2020   Hypertension    Prostate cancer ([place])    Current Outpatient Medications on File Prior to Visit  Medication Sig Dispense Refill   amLODipine (NORVASC) 5 MG tablet Take 1 tablet by mouth once daily 30 tablet 0   ascorbic Acid (VITAMIN C) 500 MG CPCR Take by mouth.     B  Complex-Biotin-FA (B-50 COMPLEX PO) Take by mouth.     calcium-vitamin D (OSCAL WITH D) 500-5 MG-MCG tablet Take 1 tablet by mouth.     Coenzyme Q10 100 MG capsule Take by mouth.     FAMOTIDINE PO Take by mouth.     Garlic 10 MG CAPS Take 7,408 mg by mouth.     MAGNESIUM PO Take by mouth.     Omega-3 Fatty Acids (FISH OIL) 1000 MG CAPS Take by mouth.     Probiotic Product (PROBIOTIC DAILY PO) Take by mouth.     rosuvastatin (CRESTOR) 10 MG tablet Take 1 tablet (10 mg total) by mouth daily. 90 tablet 3   tamsulosin (FLOMAX) 0.4 MG CAPS capsule Take 1 capsule by mouth once daily 30 capsule 0   TURMERIC PO Take by mouth.     valsartan-hydrochlorothiazide (DIOVAN-HCT) 160-25 MG tablet Take 1 tablet by mouth once daily 90 tablet 0   VITAMIN A OP Apply to eye.     vitamin E 180 MG (400 UNITS) capsule Take by mouth.     XTANDI 40 MG capsule Take 160 mg by mouth daily.     No current facility-administered medications on file prior to visit.     Objective: BP 130/82   Pulse 77   Wt 210 lb 12.8 oz (95.6 kg)   SpO2 98%   BMI 30.68  kg/m   BP Readings from Last 3 Encounters:  06/17/22 130/82  02/28/22 130/82  05/24/21 124/74   Wt Readings from Last 3 Encounters:  06/17/22 210 lb 12.8 oz (95.6 kg)  02/28/22 206 lb 9.6 oz (93.7 kg)  05/24/21 205 lb (93 kg)   General appearence: alert, no distress, WD/WN,  HEENT: normocephalic, sclerae anicteric, TMs pearly, nares patent, no discharge or erythema, pharynx normal Neck: supple, no lymphadenopathy, no thyromegaly, no masses, no JVD Heart: RRR, normal S1, S2, no murmurs Lungs: CTA bilaterally, no wheezes, rhonchi, or rales Pulses: 2+ symmetric, upper and lower extremities, normal cap refill Ext: no edema      Assessment: Encounter Diagnoses  Name Primary?   History of prostate cancer Yes   Essential hypertension, benign    Chronic kidney disease, stage 3a ([place])    Vaccine refused by patient    Vaccine counseling    Urine  frequency    Shortness of breath    Elevated glucose    Need for hepatitis C screening test    Arthralgia, unspecified joint      Plan: Generalized shortness of breath ongoing particular with stairs.  I reviewed his echocardiogram from last year that showed some impaired relaxation phase.  He sees cardiology again soon and will likely have an updated ultrasound this year.  I offered chest x-ray and PFT today but he declines.  Routine labs as below today.  History of prostate cancer in remission-managed by urology, I reviewed his recent August 2023 notes.  He has been seeing them regularly throughout the year  Hypertension-continue current medications  Coronary artery disease, hyperlipidemia-he is compliant with statin.  He is nonfasting today that.  He will have a lipid panel done with cardiology soon  Impaired glucose-lab screening today for diabetes  CKD-updated labs today  Urinary frequency, follow-up with urology, continue tamsulosin  Chart shows a history rheumatoid arthritis but he is not so sure that is the correct diagnosis.  Looking back in the labs last year for rheumatoid screening his labs are normal so this could be incorrect.  He does get some joint pain and swelling but nothing too significant  We discussed recommended vaccines.  He declines vaccines in general  Keymarion was seen today for med check.  Diagnoses and all orders for this visit:  History of prostate cancer  Essential hypertension, benign -     Comprehensive metabolic panel -     TSH  Chronic kidney disease, stage 3a (HCC) -     CBC with Differential/Platelet -     Comprehensive metabolic panel  Vaccine refused by patient  Vaccine counseling  Urine frequency  Shortness of breath -     TSH -     Brain natriuretic peptide  Elevated glucose -     Hemoglobin A1c  Need for hepatitis C screening test -     Hepatitis C antibody  Arthralgia, unspecified joint -     Sedimentation rate  Spent  > 45 minutes face to face with patient in discussion of symptoms, evaluation, plan and recommendations.    F/u pending labs

## 2022-06-19 LAB — COMPREHENSIVE METABOLIC PANEL
ALT: 15 IU/L (ref 0–44)
AST: 20 IU/L (ref 0–40)
Albumin/Globulin Ratio: 1.7 (ref 1.2–2.2)
Albumin: 4.5 g/dL (ref 3.8–4.8)
Alkaline Phosphatase: 75 IU/L (ref 44–121)
BUN/Creatinine Ratio: 24 (ref 10–24)
BUN: 28 mg/dL — ABNORMAL HIGH (ref 8–27)
Bilirubin Total: 0.3 mg/dL (ref 0.0–1.2)
CO2: 24 mmol/L (ref 20–29)
Calcium: 9.7 mg/dL (ref 8.6–10.2)
Chloride: 98 mmol/L (ref 96–106)
Creatinine, Ser: 1.17 mg/dL (ref 0.76–1.27)
Globulin, Total: 2.6 g/dL (ref 1.5–4.5)
Glucose: 93 mg/dL (ref 70–99)
Potassium: 4 mmol/L (ref 3.5–5.2)
Sodium: 139 mmol/L (ref 134–144)
Total Protein: 7.1 g/dL (ref 6.0–8.5)
eGFR: 65 mL/min/{1.73_m2} (ref >59)

## 2022-06-19 LAB — CBC WITH DIFFERENTIAL/PLATELET
Basophils Absolute: 0.1 10*3/uL (ref 0.0–0.2)
Basos: 1 %
EOS (ABSOLUTE): 0.2 10*3/uL (ref 0.0–0.4)
Eos: 3 %
Hematocrit: 43 % (ref 37.5–51.0)
Hemoglobin: 14.5 g/dL (ref 13.0–17.7)
Immature Grans (Abs): 0 10*3/uL (ref 0.0–0.1)
Immature Granulocytes: 0 %
Lymphocytes Absolute: 1.5 10*3/uL (ref 0.7–3.1)
Lymphs: 20 %
MCH: 30.7 pg (ref 26.6–33.0)
MCHC: 33.7 g/dL (ref 31.5–35.7)
MCV: 91 fL (ref 79–97)
Monocytes Absolute: 0.6 10*3/uL (ref 0.1–0.9)
Monocytes: 8 %
Neutrophils Absolute: 4.8 10*3/uL (ref 1.4–7.0)
Neutrophils: 68 %
Platelets: 306 10*3/uL (ref 150–450)
RBC: 4.72 x10E6/uL (ref 4.14–5.80)
RDW: 13.1 % (ref 11.6–15.4)
WBC: 7.1 10*3/uL (ref 3.4–10.8)

## 2022-06-19 LAB — HEPATITIS C ANTIBODY: Hep C Virus Ab: NONREACTIVE

## 2022-06-19 LAB — TSH: TSH: 2.16 u[IU]/mL (ref 0.450–4.500)

## 2022-06-19 LAB — BRAIN NATRIURETIC PEPTIDE: BNP: 28 pg/mL (ref 0.0–100.0)

## 2022-06-19 LAB — HEMOGLOBIN A1C
Est. average glucose Bld gHb Est-mCnc: 126 mg/dL
Hgb A1c MFr Bld: 6 % — ABNORMAL HIGH (ref 4.8–5.6)

## 2022-06-19 LAB — SEDIMENTATION RATE: Sed Rate: 11 mm/hr (ref 0–30)

## 2022-07-01 ENCOUNTER — Other Ambulatory Visit: Payer: Medicare Other

## 2022-07-02 ENCOUNTER — Ambulatory Visit: Payer: Medicare Other | Attending: Cardiology

## 2022-07-02 DIAGNOSIS — R931 Abnormal findings on diagnostic imaging of heart and coronary circulation: Secondary | ICD-10-CM

## 2022-07-02 DIAGNOSIS — I10 Essential (primary) hypertension: Secondary | ICD-10-CM

## 2022-07-02 DIAGNOSIS — Z79899 Other long term (current) drug therapy: Secondary | ICD-10-CM

## 2022-07-03 LAB — COMPREHENSIVE METABOLIC PANEL
ALT: 15 IU/L (ref 0–44)
AST: 19 IU/L (ref 0–40)
Albumin/Globulin Ratio: 1.7 (ref 1.2–2.2)
Albumin: 4.2 g/dL (ref 3.8–4.8)
Alkaline Phosphatase: 72 IU/L (ref 44–121)
BUN/Creatinine Ratio: 22 (ref 10–24)
BUN: 31 mg/dL — ABNORMAL HIGH (ref 8–27)
Bilirubin Total: 0.4 mg/dL (ref 0.0–1.2)
CO2: 27 mmol/L (ref 20–29)
Calcium: 8.8 mg/dL (ref 8.6–10.2)
Chloride: 100 mmol/L (ref 96–106)
Creatinine, Ser: 1.41 mg/dL — ABNORMAL HIGH (ref 0.76–1.27)
Globulin, Total: 2.5 g/dL (ref 1.5–4.5)
Glucose: 110 mg/dL — ABNORMAL HIGH (ref 70–99)
Potassium: 3.8 mmol/L (ref 3.5–5.2)
Sodium: 140 mmol/L (ref 134–144)
Total Protein: 6.7 g/dL (ref 6.0–8.5)
eGFR: 52 mL/min/{1.73_m2} — ABNORMAL LOW (ref >59)

## 2022-07-03 LAB — CBC
Hematocrit: 41.4 % (ref 37.5–51.0)
Hemoglobin: 13.8 g/dL (ref 13.0–17.7)
MCH: 29.8 pg (ref 26.6–33.0)
MCHC: 33.3 g/dL (ref 31.5–35.7)
MCV: 89 fL (ref 79–97)
Platelets: 301 10*3/uL (ref 150–450)
RBC: 4.63 x10E6/uL (ref 4.14–5.80)
RDW: 13 % (ref 11.6–15.4)
WBC: 6.6 10*3/uL (ref 3.4–10.8)

## 2022-07-03 LAB — LIPID PANEL
Chol/HDL Ratio: 3.4 ratio (ref 0.0–5.0)
Cholesterol, Total: 170 mg/dL (ref 100–199)
HDL: 50 mg/dL (ref >39)
LDL Chol Calc (NIH): 85 mg/dL (ref 0–99)
Triglycerides: 212 mg/dL — ABNORMAL HIGH (ref 0–149)
VLDL Cholesterol Cal: 35 mg/dL (ref 5–40)

## 2022-07-08 ENCOUNTER — Other Ambulatory Visit: Payer: Self-pay | Admitting: Medical

## 2022-07-08 ENCOUNTER — Ambulatory Visit: Payer: Medicare Other | Admitting: Cardiology

## 2022-07-15 ENCOUNTER — Other Ambulatory Visit: Payer: Self-pay | Admitting: Medical

## 2022-07-17 ENCOUNTER — Telehealth: Payer: Self-pay

## 2022-07-17 DIAGNOSIS — Z79899 Other long term (current) drug therapy: Secondary | ICD-10-CM

## 2022-07-17 DIAGNOSIS — R931 Abnormal findings on diagnostic imaging of heart and coronary circulation: Secondary | ICD-10-CM

## 2022-07-17 MED ORDER — ROSUVASTATIN CALCIUM 20 MG PO TABS: 20.0000 mg | ORAL_TABLET | Freq: Every day | ORAL | 3 refills | Status: DC

## 2022-07-17 NOTE — Telephone Encounter (Signed)
The patient has been notified of the result and verbalized understanding.  All questions (if any) were answered. Bernestine Amass, RN 07/17/2022 9:18 AM  Prescription has been sent in. Labs have been scheduled.

## 2022-07-17 NOTE — Telephone Encounter (Signed)
-----   Message from Jerline Pain, MD sent at 07/03/2022  6:27 AM EST ----- LDL 85 from 145 on Crestor '10mg'$  Goal is < 70 with CAD Let's increase Crestor to '20mg'$  and repeat lipids and BMET in 2 months.   Creatinine is mildly elevated 1.4 from 1.1. Continue to monitor.  No anemia  Candee Furbish, MD

## 2022-08-27 ENCOUNTER — Other Ambulatory Visit: Payer: Self-pay | Admitting: Medical

## 2022-09-05 ENCOUNTER — Ambulatory Visit: Payer: Medicare Other | Attending: Cardiology

## 2022-09-05 DIAGNOSIS — R931 Abnormal findings on diagnostic imaging of heart and coronary circulation: Secondary | ICD-10-CM

## 2022-09-05 DIAGNOSIS — Z79899 Other long term (current) drug therapy: Secondary | ICD-10-CM

## 2022-09-05 LAB — ALT: ALT: 16 IU/L (ref 0–44)

## 2022-09-05 LAB — LIPID PANEL
Chol/HDL Ratio: 2.5 ratio (ref 0.0–5.0)
Cholesterol, Total: 122 mg/dL (ref 100–199)
HDL: 48 mg/dL (ref >39)
LDL Chol Calc (NIH): 54 mg/dL (ref 0–99)
Triglycerides: 110 mg/dL (ref 0–149)
VLDL Cholesterol Cal: 20 mg/dL (ref 5–40)

## 2022-09-08 ENCOUNTER — Encounter: Payer: Self-pay | Admitting: Cardiology

## 2022-09-08 ENCOUNTER — Ambulatory Visit: Payer: Medicare Other | Attending: Cardiology | Admitting: Cardiology

## 2022-09-08 VITALS — BP 128/70 | HR 58 | Ht 69.5 in | Wt 205.8 lb

## 2022-09-08 DIAGNOSIS — R931 Abnormal findings on diagnostic imaging of heart and coronary circulation: Secondary | ICD-10-CM | POA: Insufficient documentation

## 2022-09-08 DIAGNOSIS — I119 Hypertensive heart disease without heart failure: Secondary | ICD-10-CM | POA: Insufficient documentation

## 2022-09-08 DIAGNOSIS — R0602 Shortness of breath: Secondary | ICD-10-CM | POA: Insufficient documentation

## 2022-09-08 DIAGNOSIS — I10 Essential (primary) hypertension: Secondary | ICD-10-CM | POA: Insufficient documentation

## 2022-09-08 NOTE — Progress Notes (Signed)
Cardiology Office Note:    Date:  09/08/2022   ID:  Grey Rakestraw, DOB 1946-02-19, MRN [id number]  PCP:  Carlena Hurl, PA-C   [facility] Providers Cardiologist:  Candee Furbish, MD     Referring MD: Carlena Hurl, PA-C   History of Present Illness:    Peter Russell is a 77 y.o. male here for follow-up of elevated coronary calcium score, prevention.  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  Now that he has Crestor 20 mg, LDL is 54 2024. Hemoglobin A1c 5.4.  Creatinine 1.0 hemoglobin 14 potassium 3.9 ALT 9.  He states that occasionally he will have shortness of breath after traversing 20 steps.  He has nonexertional chest discomfort.  He has been told in the past that this may be GERD or partial metastasis from a prostate cancer on his sternum.  Overall it is nonexertional type pain.  Past Medical History:  Diagnosis Date   Arthritis    sternum and knees   CKD (chronic kidney disease) stage 3, GFR 30-59 ml/min (HCC) 2020   Hypertension    Prostate cancer [facility])     Past Surgical History:  Procedure Laterality Date   APPENDECTOMY     cataract surgery Bilateral    06/2021, 08/2021   NASAL SINUS SURGERY     PROSTATE CRYOABLATION      Current Medications: Current Meds  Medication Sig   amLODipine (NORVASC) 5 MG tablet Take 1 tablet by mouth once daily   ascorbic Acid (VITAMIN C) 500 MG CPCR Take by mouth.   B Complex-Biotin-FA (B-50 COMPLEX PO) Take by mouth.   calcium-vitamin D (OSCAL WITH D) 500-5 MG-MCG tablet Take 1 tablet by mouth.   Coenzyme Q10 100 MG capsule Take by mouth.   FAMOTIDINE PO Take by mouth.   Garlic 10 MG CAPS Take 8,032 mg by mouth.   MAGNESIUM PO Take by mouth.   Omega-3 Fatty Acids (FISH OIL) 1000 MG CAPS Take by mouth.   Potassium (POTASSIMIN PO) Take 1 tablet by mouth daily at 6 (six) AM.   Probiotic Product  (PROBIOTIC DAILY PO) Take by mouth.   rosuvastatin (CRESTOR) 20 MG tablet Take 1 tablet (20 mg total) by mouth daily.   tamsulosin (FLOMAX) 0.4 MG CAPS capsule Take 1 capsule by mouth once daily   TURMERIC PO Take by mouth.   valsartan-hydrochlorothiazide (DIOVAN-HCT) 160-25 MG tablet Take 1 tablet by mouth once daily   VITAMIN A OP Apply to eye.   vitamin E 180 MG (400 UNITS) capsule Take by mouth.   XTANDI 40 MG capsule Take 80 mg by mouth daily.     Allergies:   Patient has no known allergies.   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  Has children.  Was living in [place] prior, moved to [place] fall 2020.   As of  02/2021   Social Determinants of Health   Financial Resource Strain: Low Risk  (02/28/2022)   Overall Financial Resource Strain (CARDIA)    Difficulty of Paying Living Expenses: Not hard at all  Food Insecurity: No Food Insecurity (02/28/2022)   Hunger Vital Sign    Worried About Running Out of Food in the Last Year: Never true    Ran Out of Food in the Last Year: Never true  Transportation Needs: No Transportation Needs (02/28/2022)   PRAPARE - Hydrologist (Medical): No    Lack of Transportation (Non-Medical): No  Physical Activity: Insufficiently Active (02/28/2022)   Exercise Vital Sign    Days of Exercise per Week: 2 days    Minutes of Exercise per  Session: 20 min  Stress: No Stress Concern Present (02/28/2022)   [place]    Feeling of Stress : Not at all  Social Connections: Not on file     Family History: The patient's family history includes Alcohol abuse in his father; Cancer in his brother; Diabetes in his father.  ROS:   Please see the history of present illness.    No fevers chills nausea vomiting syncope bleeding all other systems reviewed and are negative.  EKGs/Labs/Other Studies Reviewed:    The following studies were reviewed today: Prior office notes reviewed, EKG, labs  Coronary calcium score 2022: Coronary calcium score of 83 which was 35 percentile for age.  Distal esophageal wall thickening was suspicious for esophagitis/irritation possible GERD.  This may be cause for chest pain.  Aortic atherosclerosis was also noted.  Thoracic vertebral area noted from known prostate cancer that correlated with prior bone scan in April 2021, which urology, Dr. Tresa Moore, is aware.   Given the aortic atherosclerosis as well as calcified plaque and coronary arteries, we gave him Crestor which is now at 20 mg.   EKG:  EKG is  ordered today.  The ekg ordered today demonstrates 09/08/2022-sinus bradycardia 58 nonspecific ST-T wave changes prior sinus rhythm 85 with 2 PVCs nonspecific ST-T wave changes  Recent Labs: 06/17/2022: BNP 28.0; TSH 2.160 07/02/2022: BUN 31; Creatinine, Ser 1.41; Hemoglobin 13.8; Platelets 301; Potassium 3.8; Sodium 140 09/05/2022: ALT 16  Recent Lipid Panel    Component Value Date/Time   CHOL 122 09/05/2022 0812   TRIG 110 09/05/2022 0812   HDL 48 09/05/2022 0812   CHOLHDL 2.5 09/05/2022 0812   LDLCALC 54 09/05/2022 0812     Risk Assessment/Calculations:          Physical Exam:    VS:  BP 128/70   Pulse (!) 58   Ht 5' 9.5" (1.765 m)   Wt 205 lb 12.8 oz (93.4 kg)   SpO2 95%   BMI 29.96 kg/m     Wt Readings from Last 3  Encounters:  09/08/22 205 lb 12.8 oz (93.4 kg)  06/17/22 210 lb 12.8 oz (95.6 kg)  02/28/22 206 lb 9.6 oz (93.7 kg)     GEN:  Well nourished, well developed in no acute distress HEENT: Normal NECK: No JVD; No carotid bruits LYMPHATICS: No lymphadenopathy CARDIAC: RRR, no murmurs, rubs, gallops RESPIRATORY:  Clear to auscultation without rales, wheezing or rhonchi  ABDOMEN: Soft, non-tender, non-distended MUSCULOSKELETAL:  No edema; No  deformity  SKIN: Warm and dry NEUROLOGIC:  Alert and oriented x 3 PSYCHIATRIC:  Normal affect   ASSESSMENT:    1. Elevated coronary artery calcium score   2. Essential hypertension, benign   3. Hypertensive heart disease without heart failure   4. Shortness of breath     PLAN:    In order of problems listed above:   Coronary artery calcification/atherosclerosis - Now on Crestor 20 mg once a day with goal LDL less than 70.  At last check 33 in Jan 2024. -Esophageal thickening noted on CT scan.  Possible GERD.  He does take an occasional famotidine.  I asked him to discuss this with his primary care provider.  Shortness of breath Echocardiogram reassuring 2022.  CT without any obstructive coronary artery disease.  Encouraged weight loss, continued conditioning.  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  Encouraged further exercise.  PVCs (premature ventricular contractions) 2 PVCs seen on prior ECG.  No higher symptoms such as syncope.  Echocardiogram reassuring.  Atypical chest pain Likely musculoskeletal.  GERD, esophageal thickening noted on CT.  We will keep an eye on this.     Medication Adjustments/Labs and Tests Ordered: Current medicines are reviewed at length with the patient today.  Concerns regarding medicines are outlined above.  Orders Placed This Encounter  Procedures   EKG 12-Lead    No orders of the defined types were placed in this  encounter.    Patient Instructions  Medication Instructions:  The current medical regimen is effective;  continue present plan and medications.  *If you need a refill on your cardiac medications before your next appointment, please call your pharmacy*  Follow-Up: At [facility], you and your health needs are our priority.  As part of our continuing mission to provide you with exceptional heart care, we have created designated Provider Care Teams.  These Care Teams include your primary Cardiologist (physician) and Advanced Practice Providers (APPs -  Physician Assistants and Nurse Practitioners) who all work together to provide you with the care you need, when you need it.  We recommend signing up for the patient portal called "MyChart".  Sign up information is provided on this After Visit Summary.  MyChart is used to connect with patients for Virtual Visits (Telemedicine).  Patients are able to view lab/test results, encounter notes, upcoming appointments, etc.  Non-urgent messages can be sent to your provider as well.   To learn more about what you can do with MyChart, go to [email or web address].    Your next appointment:   1 year(s)  Provider:   Candee Furbish, MD        Signed, Candee Furbish, MD  09/08/2022 9:05 AM    [facility]

## 2022-09-08 NOTE — Patient Instructions (Signed)
Medication Instructions:  The current medical regimen is effective;  continue present plan and medications.  *If you need a refill on your cardiac medications before your next appointment, please call your pharmacy*  Follow-Up: At [facility], you and your health needs are our priority.  As part of our continuing mission to provide you with exceptional heart care, we have created designated Provider Care Teams.  These Care Teams include your primary Cardiologist (physician) and Advanced Practice Providers (APPs -  Physician Assistants and Nurse Practitioners) who all work together to provide you with the care you need, when you need it.  We recommend signing up for the patient portal called "MyChart".  Sign up information is provided on this After Visit Summary.  MyChart is used to connect with patients for Virtual Visits (Telemedicine).  Patients are able to view lab/test results, encounter notes, upcoming appointments, etc.  Non-urgent messages can be sent to your provider as well.   To learn more about what you can do with MyChart, go to [email or web address].    Your next appointment:   1 year(s)  Provider:   Mark Skains, MD      

## 2022-10-06 ENCOUNTER — Other Ambulatory Visit: Payer: Self-pay | Admitting: Medical

## 2022-12-18 ENCOUNTER — Ambulatory Visit (INDEPENDENT_AMBULATORY_CARE_PROVIDER_SITE_OTHER): Payer: Medicare Other

## 2022-12-18 ENCOUNTER — Ambulatory Visit (INDEPENDENT_AMBULATORY_CARE_PROVIDER_SITE_OTHER): Payer: Medicare Other | Admitting: Podiatry

## 2022-12-18 DIAGNOSIS — M722 Plantar fascial fibromatosis: Secondary | ICD-10-CM | POA: Diagnosis not present

## 2022-12-18 MED ORDER — MELOXICAM 15 MG PO TABS: 15.0000 mg | ORAL_TABLET | Freq: Every day | ORAL | 3 refills | Status: DC

## 2022-12-18 NOTE — Patient Instructions (Signed)

## 2022-12-23 NOTE — Progress Notes (Signed)
  Subjective:  Patient ID: Peter Russell, male    DOB: 26-Apr-1946,  MRN: [id number]  Chief Complaint  Patient presents with   Plantar Fasciitis    left heel pain, suggesting plantar fasciitis, would like a diagnosis and relief    77 y.o. male presents with the above complaint. History confirmed with patient.  Has been going on for several weeks he has been doing some stretching  Objective:  Physical Exam: warm, good capillary refill, no trophic changes or ulcerative lesions, normal DP and PT pulses, and normal sensory exam. Left Foot: point tenderness over the heel pad   Radiographs: Multiple views x-ray of the left foot: no fracture, dislocation, swelling or degenerative changes noted Assessment:   1. Plantar fasciitis of left foot      Plan:  Patient was evaluated and treated and all questions answered.  Discussed the etiology and treatment options for plantar fasciitis including stretching, formal physical therapy, supportive shoegears such as a running shoe or sneaker, pre fabricated orthoses, injection therapy, and oral medications. We also discussed the role of surgical treatment of this for patients who do not improve after exhausting non-surgical treatment options.   -XR reviewed with patient -Educated patient on stretching and icing of the affected limb -Injection delivered to the plantar fascia of the left foot. -Rx for meloxicam. Educated on use, risks and benefits of the medication  After sterile prep with povidone-iodine solution and alcohol, the left heel was injected with 0.5cc 2% xylocaine plain, 0.5cc 0.5% marcaine plain, 5mg  triamcinolone acetonide, and 2mg  dexamethasone was injected along the medial plantar fascia at the insertion on the plantar calcaneus. The patient tolerated the procedure well without complication.  Return if symptoms worsen or fail to improve.

## 2023-01-08 ENCOUNTER — Encounter: Payer: Self-pay | Admitting: Podiatry

## 2023-02-20 ENCOUNTER — Other Ambulatory Visit: Payer: Self-pay | Admitting: Medical

## 2023-02-20 ENCOUNTER — Ambulatory Visit
Admission: RE | Admit: 2023-02-20 | Discharge: 2023-02-20 | Disposition: A | Discharge status: Home / Self Care | Payer: Medicare Other | Source: Ambulatory Visit | Attending: Medical | Admitting: Medical

## 2023-02-20 ENCOUNTER — Ambulatory Visit (INDEPENDENT_AMBULATORY_CARE_PROVIDER_SITE_OTHER): Payer: Medicare Other | Admitting: Medical

## 2023-02-20 VITALS — BP 136/78 | HR 110 | Temp 101.9°F | Wt 217.6 lb

## 2023-02-20 DIAGNOSIS — K219 Gastro-esophageal reflux disease without esophagitis: Secondary | ICD-10-CM

## 2023-02-20 DIAGNOSIS — R052 Subacute cough: Secondary | ICD-10-CM | POA: Diagnosis not present

## 2023-02-20 DIAGNOSIS — N1831 Chronic kidney disease, stage 3a: Secondary | ICD-10-CM | POA: Diagnosis not present

## 2023-02-20 DIAGNOSIS — R509 Fever, unspecified: Secondary | ICD-10-CM

## 2023-02-20 DIAGNOSIS — R9389 Abnormal findings on diagnostic imaging of other specified body structures: Secondary | ICD-10-CM

## 2023-02-20 DIAGNOSIS — C61 Malignant neoplasm of prostate: Secondary | ICD-10-CM

## 2023-02-20 LAB — POC COVID19 BINAXNOW: SARS Coronavirus 2 Ag: NEGATIVE

## 2023-02-20 LAB — BASIC METABOLIC PANEL
BUN/Creatinine Ratio: 22 (ref 10–24)
BUN: 30 mg/dL — ABNORMAL HIGH (ref 8–27)
CO2: 22 mmol/L (ref 20–29)
Calcium: 9.4 mg/dL (ref 8.6–10.2)
Chloride: 101 mmol/L (ref 96–106)
Creatinine, Ser: 1.38 mg/dL — ABNORMAL HIGH (ref 0.76–1.27)
Glucose: 143 mg/dL — ABNORMAL HIGH (ref 70–99)
Potassium: 4.1 mmol/L (ref 3.5–5.2)
Sodium: 137 mmol/L (ref 134–144)
eGFR: 53 mL/min/{1.73_m2} — ABNORMAL LOW (ref >59)

## 2023-02-20 LAB — CBC WITH DIFFERENTIAL/PLATELET
Basophils Absolute: 0 10*3/uL (ref 0.0–0.2)
Basos: 0 %
EOS (ABSOLUTE): 0.1 10*3/uL (ref 0.0–0.4)
Eos: 0 %
Hematocrit: 42.4 % (ref 37.5–51.0)
Hemoglobin: 14.3 g/dL (ref 13.0–17.7)
Lymphocytes Absolute: 0.5 10*3/uL — ABNORMAL LOW (ref 0.7–3.1)
Lymphs: 3 %
MCH: 30 pg (ref 26.6–33.0)
MCHC: 33.7 g/dL (ref 31.5–35.7)
MCV: 89 fL (ref 79–97)
Monocytes Absolute: 0.5 10*3/uL (ref 0.1–0.9)
Monocytes: 3 %
Neutrophils Absolute: 15.7 10*3/uL — ABNORMAL HIGH (ref 1.4–7.0)
Neutrophils: 94 %
Platelets: 319 10*3/uL (ref 150–450)
RBC: 4.76 x10E6/uL (ref 4.14–5.80)
RDW: 15 % (ref 11.6–15.4)
WBC: 16.7 10*3/uL — ABNORMAL HIGH (ref 3.4–10.8)

## 2023-02-20 LAB — POCT INFLUENZA A/B
Influenza A, POC: NEGATIVE
Influenza B, POC: NEGATIVE

## 2023-02-20 LAB — POCT URINALYSIS DIP (PROADVANTAGE DEVICE)
Bilirubin, UA: NEGATIVE
Blood, UA: NEGATIVE
Glucose, UA: NEGATIVE mg/dL
Ketones, POC UA: NEGATIVE mg/dL
Leukocytes, UA: NEGATIVE
Nitrite, UA: NEGATIVE
Protein Ur, POC: 30 mg/dL — AB
Specific Gravity, Urine: 1.015
Urobilinogen, Ur: NEGATIVE
pH, UA: 6 (ref 5.0–8.0)

## 2023-02-20 MED ORDER — OMEPRAZOLE 40 MG PO CPDR: 40.0000 mg | DELAYED_RELEASE_CAPSULE | Freq: Every day | ORAL | 0 refills | Status: DC

## 2023-02-20 MED ORDER — AMOXICILLIN-POT CLAVULANATE 125-31.25 MG/5ML PO SUSR: 125.0000 mg | Freq: Two times a day (BID) | ORAL | 0 refills | Status: AC

## 2023-02-20 NOTE — Telephone Encounter (Signed)
Pt has an appt today.  

## 2023-02-20 NOTE — Patient Instructions (Addendum)
Acid reflux, trouble swallowing Begin omeprazole 1 tablet 30 to 40 minutes before breakfast Take omeprazole for the next 2 to 3 weeks You can also use famotidine Pepcid over-the-counter at night Avoid foods that make reflux or acid worse such as in the handout below We may want to consider referral to gastroenterology for further evaluation Particularly if you are having trouble swallowing meat or if you swallow and immediately have to vomit food up, then I would recommend referral to gastroenterology You had a CT scan back in 2018 that showed some thickening of the esophagus.  I do not see any prior endoscopy.  If you have not had endoscopy and I would recommend referral to gastroenterology as well  Today you have acute fever and chills that started this morning I am checking some stat labs today You are negative for flu and COVID today but sometimes if you are early in the symptoms the COVID test can sometimes be falsely negative I do recommend a home COVID test tomorrow or Sunday.  If positive call our after-hours line or get reevaluated for potential treatment such as Paxlovid Increase water intake over the weekend at least 80 to 100 ounces of water daily Rest this weekend, and eat small portions of easily digestible foods this weekend such as soup, rice, toast, nothing too heavy Right now you do not really have a whole lot of symptoms other than chills and fever that started this morning and your ongoing acid reflux symptoms.  Thus it is hard to say what is going on but vital signs suggest you are dealing with some type of illness or infection It may be worth getting a chest x-ray as well I will put in the order for chest x-ray in case he wants to go for x-ray today to further evaluate fever  If you are worse in the next 48 hours such as extreme weakness, dehydrated, uncontrollable vomiting, or worse symptoms overall not doing well then go to the emergency department to get  reevaluated   Please go to [facility] for your chest xray.   Their hours are 8am - 4:30 pm Monday - Friday.  Take your insurance card with you.  [facility] [phone]   [address] [place], [place] [address]    Gastroesophageal Reflux Disease, Adult   Gastroesophageal reflux disease (GERD) happens when acid from your stomach flows up into the esophagus. When acid comes in contact with the esophagus, the acid causes soreness (inflammation) in the esophagus. Over time, GERD may create small holes (ulcers) in the lining of the esophagus.  CAUSES  Increased body weight. This puts pressure on the stomach, making acid rise from the stomach into the esophagus.  Smoking. This increases acid production in the stomach.  Drinking alcohol. This causes decreased pressure in the lower esophageal sphincter (valve or ring of muscle between the esophagus and stomach), allowing acid from the stomach into the esophagus.  Late evening meals and a full stomach. This increases pressure and acid production in the stomach.  A malformed lower esophageal sphincter.  Sometimes, no cause is found.  SYMPTOMS  Burning pain in the lower part of the mid-chest behind the breastbone and in the mid-stomach area. This may occur twice a week or more often.  Trouble swallowing.  Sore throat.  Dry cough.  Asthma-like symptoms including chest tightness, shortness of breath, or wheezing.   DIAGNOSIS  Your caregiver may be able to diagnose GERD based on your symptoms. In some cases,  X-rays and other tests may be done to check for complications or to check the condition of your stomach and esophagus.  TREATMENT  Medication recommended or prescribed today:  Begin Omeprazole 40mg , 1 tablet about 30-45 minutes prior to breakfast.  Use this medication for about 2 weeks daily, then as needed after that.   You may use antacids such as Tums or Rolaids periodically in addition to medications  above   HOME CARE INSTRUCTIONS  Change the factors that you can control. Ask your caregiver for guidance concerning weight loss, quitting smoking, and alcohol consumption.  Avoid foods and drinks that make your symptoms worse, such as:  Caffeine or alcoholic drinks.  Chocolate.  Peppermint or mint flavorings.  Garlic and onions.  Spicy foods.  Citrus fruits, such as oranges, lemons, or limes.  Tomato-based foods such as sauce, chili, salsa, and pizza.  Fried and fatty foods.  Avoid lying down within 2-3 hours of eating or drinking.  Eat small, frequent meals instead of large meals.  Wear loose-fitting clothing. Do not wear anything tight around your waist that causes pressure on your stomach.  Raise the head of your bed 6 to 8 inches with wood blocks to help you sleep. Extra pillows will not help.  Only take over-the-counter or prescription medicines for pain, discomfort, or fever as directed by your caregiver.  Do not take aspirin, ibuprofen, or other nonsteroidal anti-inflammatory drugs (NSAIDs).   SEEK IMMEDIATE MEDICAL CARE IF:  You have pain in your arms, neck, jaw, teeth, or back.  Your pain increases or changes in intensity or duration.  You develop nausea, vomiting, or sweating (diaphoresis).  You develop shortness of breath, or you faint.  Your vomit is green, yellow, black, or looks like coffee grounds or blood.  Your stool is red, bloody, or black.  These symptoms could be signs of other problems, such as heart disease, gastric bleeding, or esophageal bleeding. MAKE SURE YOU:  Understand these instructions.  Will watch your condition.  Will get help right away if you are not doing well or get worse.  Document Released: 05/07/2005 Document Revised: 04/09/2011 Document Reviewed: 02/14/2011 [facility] Patient Information 2012 [place], [place].

## 2023-02-20 NOTE — Progress Notes (Signed)
Chest x-ray shows possible early pneumonia in the right lung.  I sent him amoxicillin/Augmentin antibiotic to pharmacy.  I want him to start this right away.  He may also want to start some Mucinex DM over-the-counter for cough and congestion if symptoms worsen.  He can use Tylenol for fever.  Over the weekend needs to be really good about water intake and rest.  If significantly worse over the weekend go to the emergency department.  I recommend he follow-up next week here with Dr. Susann Givens to recheck

## 2023-02-20 NOTE — Progress Notes (Signed)
Subjective:  Peter Russell is a 77 y.o. male who presents for Chief Complaint  Patient presents with   Gastroesophageal Reflux    Acid reflux, feels like phelgm is coming up in his throat and choking him causing him to cough a lot in the night x 4 weeks, didn't get much sleep last night and had some chills, had to swallow   Here for acid reflux concerns and cough but has acute fever as well  He notes for a few weeks having trouble swallowing, feels stuff coming up from stomach into throat.  Already on famotidine and probiotic.  No food getting stuck, not vomiting after eating.  Does get some symptoms with spicy foods.  No alcohol.  Does eat some green peppers.  This morning got up with chills, aches, fever.  No ear pain, sometimes sore throat with the acid reflux.   Occasional cough from tickle in throat.    Feels like he needs to burp but worried about vomiting.  No nausea.  No diarrhea.  No abdominal pain.  No new urination issues.  Has hx/o urine stream slow but that is not new.  Has hx/o hot flashes and sweats from his cancer drugs.    No other aggravating or relieving factors.    No other c/o.  Past Medical History:  Diagnosis Date   Arthritis    sternum and knees   CKD (chronic kidney disease) stage 3, GFR 30-59 ml/min (HCC) 2020   Hypertension    Prostate cancer (HCC)    Current Outpatient Medications on File Prior to Visit  Medication Sig Dispense Refill   amLODipine (NORVASC) 5 MG tablet Take 1 tablet by mouth once daily 90 tablet 1   ascorbic Acid (VITAMIN C) 500 MG CPCR Take by mouth.     B Complex-Biotin-FA (B-50 COMPLEX PO) Take by mouth.     calcium-vitamin D (OSCAL WITH D) 500-5 MG-MCG tablet Take 1 tablet by mouth.     Coenzyme Q10 100 MG capsule Take by mouth.     FAMOTIDINE PO Take by mouth in the morning, at noon, in the evening, and at bedtime.     Garlic 10 MG CAPS Take 1,000 mg by mouth.     MAGNESIUM PO Take by mouth.     Omega-3 Fatty Acids (FISH OIL)  1000 MG CAPS Take by mouth.     Potassium (POTASSIMIN PO) Take 1 tablet by mouth daily at 6 (six) AM.     Probiotic Product (PROBIOTIC DAILY PO) Take by mouth.     rosuvastatin (CRESTOR) 20 MG tablet Take 1 tablet (20 mg total) by mouth daily. 90 tablet 3   TURMERIC PO Take by mouth.     VITAMIN A OP Apply to eye.     vitamin E 180 MG (400 UNITS) capsule Take by mouth.     XTANDI 40 MG capsule Take 80 mg by mouth daily.     No current facility-administered medications on file prior to visit.     The following portions of the patient's history were reviewed and updated as appropriate: allergies, current medications, past family history, past medical history, past social history, past surgical history and problem list.  ROS Otherwise as in subjective above     Objective: BP 136/78   Pulse (!) 110   Temp (!) 101.9 F (38.8 C)   Wt 217 lb 9.6 oz (98.7 kg)   BMI 31.67 kg/m   Wt Readings from Last 3 Encounters:  02/20/23 217 lb  9.6 oz (98.7 kg)  09/08/22 205 lb 12.8 oz (93.4 kg)  06/17/22 210 lb 12.8 oz (95.6 kg)   BP Readings from Last 3 Encounters:  02/20/23 136/78  09/08/22 128/70  06/17/22 130/82    General appearance: somewhat pale, alert,somewhat ill appearing, well developed HEENT: normocephalic, sclerae anicteric, conjunctiva pink and moist, TMs pearly, nares patent, no discharge or erythema, pharynx normal Oral cavity: MMM, no lesions Neck: supple, no lymphadenopathy, no thyromegaly, no masses Heart: a little tachycardic, otherwise RR, normal S1, S2, no murmurs Lungs: CTA bilaterally, no wheezes, rhonchi, or rales Abdomen: +bs, soft, non tender, non distended, no masses, no hepatomegaly, no splenomegaly Pulses: 2+ radial pulses, 2+ pedal pulses, normal cap refill Ext: no edema No obvious rash    Assessment: Encounter Diagnoses  Name Primary?   Fever, unspecified fever cause Yes   Subacute cough    Gastroesophageal reflux disease, unspecified whether  esophagitis present    Chronic kidney disease, stage 3a (HCC)    Prostate cancer (HCC)    Abnormal chest CT      Plan: We discussed symptoms and exam findings  Acid reflux, trouble swallowing Begin omeprazole 1 tablet 30 to 40 minutes before breakfast Take omeprazole for the next 2 to 3 weeks You can also use famotidine Pepcid over-the-counter at night Avoid foods that make reflux or acid worse such as in the handout below We may want to consider referral to gastroenterology for further evaluation Particularly if you are having trouble swallowing meat or if you swallow and immediately have to vomit food up, then I would recommend referral to gastroenterology You had a CT scan back in 2018 that showed some thickening of the esophagus.  I do not see any prior endoscopy.  If you have not had endoscopy and I would recommend referral to gastroenterology as well  Today you have acute fever and chills that started this morning I am checking some stat labs today You are negative for flu and COVID today but sometimes if you are early in the symptoms the COVID test can sometimes be falsely negative I do recommend a home COVID test tomorrow or Sunday.  If positive call our after-hours line or get reevaluated for potential treatment such as Paxlovid Increase water intake over the weekend at least 80 to 100 ounces of water daily Rest this weekend, and I will adjust the small portions of easily digestible foods this weekend such as soup, rice, toast, nothing too heavy Right now you do not really have a whole lot of symptoms other than chills and fever that started this morning and your ongoing acid reflux symptoms.  Thus it is hard to say what is going on but vital signs suggest you are dealing with some type of illness or infection It may be worth getting a chest x-ray as well I will put in the order for chest x-ray in case he wants to go for x-ray today to further evaluate fever  If you are worse in  the next 48 hours such as extreme weakness, dehydrated, uncontrollable vomiting, or worse symptoms overall not doing well then go to the emergency department to get reevaluated    Peter Russell was seen today for gastroesophageal reflux.  Diagnoses and all orders for this visit:  Fever, unspecified fever cause -     Basic metabolic panel -     CBC with Differential/Platelet -     POCT Urinalysis DIP (Proadvantage Device) -     DG Chest 2 View;  Future -     POC COVID-19 -     Influenza A/B  Subacute cough -     DG Chest 2 View; Future -     POC COVID-19 -     Influenza A/B  Gastroesophageal reflux disease, unspecified whether esophagitis present -     Ambulatory referral to Gastroenterology  Chronic kidney disease, stage 3a (HCC)  Prostate cancer (HCC)  Abnormal chest CT -     Ambulatory referral to Gastroenterology  Other orders -     omeprazole (PRILOSEC) 40 MG capsule; Take 1 capsule (40 mg total) by mouth daily.    Follow up: pending labs

## 2023-02-20 NOTE — Progress Notes (Signed)
White blood count is elevated suggesting some type of infection.  Kidney marker is about the same as it was 7 months ago somewhat abnormal but stable.  Blood sugar up little bit today but probably was nonfasting.  Please have him go for the chest x-ray within the next hour or 2 if possible  If significantly worse over the weekend go to the hospital emergency department

## 2023-03-03 ENCOUNTER — Ambulatory Visit (INDEPENDENT_AMBULATORY_CARE_PROVIDER_SITE_OTHER): Payer: Medicare Other

## 2023-03-03 VITALS — BP 124/68 | HR 77 | Temp 98.2°F | Ht 69.5 in | Wt 210.4 lb

## 2023-03-03 DIAGNOSIS — Z0000 Encounter for general adult medical examination without abnormal findings: Secondary | ICD-10-CM | POA: Diagnosis not present

## 2023-03-03 NOTE — Progress Notes (Signed)
Subjective:   Peter Russell is a 77 y.o. male who presents for Medicare Annual/Subsequent preventive examination.  Visit Complete: In person  Patient Medicare AWV questionnaire was completed by the patient on 02/27/2023; I have confirmed that all information answered by patient is correct and no changes since this date.  Review of Systems     Cardiac Risk Factors include: advanced age (>6men, >72 women);hypertension;male gender;obesity (BMI >30kg/m2)     Objective:    Today's Vitals   03/03/23 1349  BP: 124/68  Pulse: 77  Temp: 98.2 F (36.8 C)  TempSrc: Oral  SpO2: 95%  Weight: 210 lb 6.4 oz (95.4 kg)  Height: 5' 9.5" (1.765 m)   Body mass index is 30.63 kg/m.     03/03/2023    2:00 PM 02/28/2022    1:44 PM 02/02/2020   10:08 AM  Advanced Directives  Does Patient Have a Medical Advance Directive? No No No  Would patient like information on creating a medical advance directive?  No - Patient declined     Current Medications (verified) Outpatient Encounter Medications as of 03/03/2023  Medication Sig   amLODipine (NORVASC) 5 MG tablet Take 1 tablet by mouth once daily   FAMOTIDINE PO Take by mouth in the morning, at noon, in the evening, and at bedtime.   Multiple Vitamins-Minerals (EMERGEN-C IMMUNE PO) Take by mouth.   omeprazole (PRILOSEC) 40 MG capsule Take 1 capsule (40 mg total) by mouth daily.   rosuvastatin (CRESTOR) 20 MG tablet Take 1 tablet (20 mg total) by mouth daily.   valsartan-hydrochlorothiazide (DIOVAN-HCT) 160-25 MG tablet Take 1 tablet by mouth once daily   XTANDI 40 MG capsule Take 80 mg by mouth daily.   ascorbic Acid (VITAMIN C) 500 MG CPCR Take by mouth. (Patient not taking: Reported on 03/03/2023)   B Complex-Biotin-FA (B-50 COMPLEX PO) Take by mouth. (Patient not taking: Reported on 03/03/2023)   calcium-vitamin D (OSCAL WITH D) 500-5 MG-MCG tablet Take 1 tablet by mouth. (Patient not taking: Reported on 03/03/2023)   Coenzyme Q10 100 MG capsule  Take by mouth. (Patient not taking: Reported on 03/03/2023)   Garlic 10 MG CAPS Take 1,000 mg by mouth. (Patient not taking: Reported on 03/03/2023)   MAGNESIUM PO Take by mouth. (Patient not taking: Reported on 03/03/2023)   Omega-3 Fatty Acids (FISH OIL) 1000 MG CAPS Take by mouth. (Patient not taking: Reported on 03/03/2023)   Potassium (POTASSIMIN PO) Take 1 tablet by mouth daily at 6 (six) AM. (Patient not taking: Reported on 03/03/2023)   Probiotic Product (PROBIOTIC DAILY PO) Take by mouth. (Patient not taking: Reported on 03/03/2023)   TURMERIC PO Take by mouth. (Patient not taking: Reported on 03/03/2023)   VITAMIN A OP Apply to eye. (Patient not taking: Reported on 03/03/2023)   vitamin E 180 MG (400 UNITS) capsule Take by mouth. (Patient not taking: Reported on 03/03/2023)   No facility-administered encounter medications on file as of 03/03/2023.    Allergies (verified) Patient has no known allergies.   History: Past Medical History:  Diagnosis Date   Arthritis    sternum and knees   CKD (chronic kidney disease) stage 3, GFR 30-59 ml/min (HCC) 2020   Hypertension    Prostate cancer [facility])    Past Surgical History:  Procedure Laterality Date   APPENDECTOMY     cataract surgery Bilateral    06/2021, 08/2021   NASAL SINUS SURGERY     PROSTATE CRYOABLATION     Family History  Problem Relation Age of Onset   Alcohol abuse Father    Diabetes Father    Cancer Brother        prostate   Social History   Socioeconomic History   Marital status: Widowed    Spouse name: Not on file   Number of children: Not on file   Years of education: Not on file   Highest education level: Not on file  Occupational History   Not on file  Tobacco Use   Smoking status: Former   Smokeless tobacco: Never  Vaping Use   Vaping status: Never Used  Substance and Sexual Activity   Alcohol use: Never   Drug use: Never   Sexual activity: Not on file  Other Topics Concern   Not on file  Social  History Narrative   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  Has children.  Was living in [place] prior, moved to [place] fall 2020.   As of  02/2021   Social Determinants of Health   Financial Resource Strain: Low Risk  (02/27/2023)   Overall Financial Resource Strain (CARDIA)    Difficulty of Paying Living Expenses: Not hard at all  Food Insecurity: No Food Insecurity (02/27/2023)   Hunger Vital Sign    Worried About Running Out of Food in the Last Year: Never true    Ran Out of Food in the Last Year: Never true  Transportation Needs: No Transportation Needs (02/27/2023)   PRAPARE - Administrator, Civil Service (Medical): No    Lack of Transportation (Non-Medical): No  Physical Activity: Inactive (02/27/2023)   Exercise Vital Sign    Days of Exercise per Week: 0 days    Minutes of Exercise per Session: 0 min  Stress: No Stress Concern Present (02/27/2023)   [employer] of Occupational Health - Occupational Stress Questionnaire    Feeling of Stress : Not at all  Social Connections: Unknown (02/27/2023)   Social Connection and Isolation Panel [NHANES]    Frequency of Communication with Friends and Family: Never    Frequency of Social Gatherings with Friends and Family: Twice a week    Attends Religious Services: Not on Marketing executive or Organizations: Yes    Attends Banker Meetings: Never    Marital Status: Widowed    Tobacco Counseling Counseling given: Not Answered   Clinical Intake:  Pre-visit preparation completed: Yes  Pain : No/denies pain     Nutritional Status: BMI > 30  Obese Nutritional Risks: Nausea/ vomitting/ diarrhea (vomiting this weekend) Diabetes: No  How often do you need to have someone help you when you read instructions, pamphlets, or other written materials from your doctor or pharmacy?: 1 - Never  Interpreter Needed?:  No  Information entered by :: NAllen LPN   Activities of Daily Living    02/27/2023    5:50 PM  In your present state of health, do you have any difficulty performing the following activities:  Hearing? 0  Vision? 0  Difficulty concentrating or making decisions? 0  Walking or climbing stairs? 1  Comment gets SOB  Dressing or bathing? 0  Doing errands, shopping? 0  Preparing Food and eating ? N  Using the Toilet? N  In the past six months, have you accidently leaked urine? N  Do you have problems with loss of bowel control? N  Managing your Medications? N  Managing your  Finances? N  Housekeeping or managing your Housekeeping? N    Patient Care Team: Tysinger, Kermit Balo, PA-C as PCP - General (Family Medicine) Jake Bathe, MD as PCP - Cardiology (Cardiology) Berneice Heinrich Delbert Phenix., MD as Consulting Physician (Urology) Karle Barr, MD as Referring Physician (Otolaryngology)  Indicate any recent Medical Services you may have received from other than [facility] providers in the past year (date may be approximate).     Assessment:   This is a routine wellness examination for Peter Russell.  Hearing/Vision screen Hearing Screening - Comments:: Denies hearing issues Vision Screening - Comments:: Regular eye exams, [facility]  Dietary issues and exercise activities discussed:     Goals Addressed             This Visit's Progress    Patient Stated       03/03/2023, start walking       Depression Screen    03/03/2023    2:01 PM 06/17/2022    1:02 PM 02/28/2022    1:47 PM 02/15/2021    8:34 AM 02/02/2020   10:08 AM 09/28/2019   11:14 AM  PHQ 2/9 Scores  PHQ - 2 Score 0 0 0 0 0 0  PHQ- 9 Score 3  3       Fall Risk    02/27/2023    5:50 PM 06/17/2022    1:02 PM 02/28/2022    1:46 PM 02/15/2021    8:34 AM 02/02/2020   10:08 AM  Fall Risk   Falls in the past year? 0 0 1 0 0  Comment   stepped off of the porch    Number falls in past yr: 0 0 0 0   Injury with Fall? 0 0 0 0    Risk for fall due to : Medication side effect No Fall Risks Medication side effect No Fall Risks   Follow up Falls prevention discussed;Falls evaluation completed Falls evaluation completed Falls evaluation completed;Education provided;Falls prevention discussed Falls evaluation completed     MEDICARE RISK AT HOME:  Medicare Risk at Home - 03/03/23 1413     Any stairs in or around the home? No    Home free of loose throw rugs in walkways, pet beds, electrical cords, etc? Yes    Adequate lighting in your home to reduce risk of falls? Yes    Life alert? No    Use of a cane, walker or w/c? No    Grab bars in the bathroom? No    Shower chair or bench in shower? No    Elevated toilet seat or a handicapped toilet? No             TIMED UP AND GO:  Was the test performed?  No    Cognitive Function:        03/03/2023    2:03 PM 02/28/2022    1:51 PM  6CIT Screen  What Year? 0 points 0 points  What month? 0 points 0 points  What time? 0 points 0 points  Count back from 20 0 points 0 points  Months in reverse 0 points 0 points  Repeat phrase 0 points 0 points  Total Score 0 points 0 points    Immunizations  There is no immunization history on file for this patient.  TDAP status: Due, Education has been provided regarding the importance of this vaccine. Advised may receive this vaccine at local pharmacy or Health Dept. Aware to provide a copy of the  vaccination record if obtained from local pharmacy or Health Dept. Verbalized acceptance and understanding.  Flu Vaccine status: Declined, Education has been provided regarding the importance of this vaccine but patient still declined. Advised may receive this vaccine at local pharmacy or Health Dept. Aware to provide a copy of the vaccination record if obtained from local pharmacy or Health Dept. Verbalized acceptance and understanding.  Pneumococcal vaccine status: Declined,  Education has been provided regarding the importance  of this vaccine but patient still declined. Advised may receive this vaccine at local pharmacy or Health Dept. Aware to provide a copy of the vaccination record if obtained from local pharmacy or Health Dept. Verbalized acceptance and understanding.   Covid-19 vaccine status: Declined, Education has been provided regarding the importance of this vaccine but patient still declined. Advised may receive this vaccine at local pharmacy or Health Dept.or vaccine clinic. Aware to provide a copy of the vaccination record if obtained from local pharmacy or Health Dept. Verbalized acceptance and understanding.  Qualifies for Shingles Vaccine? Yes   Zostavax completed No   Shingrix Completed?: No.    Education has been provided regarding the importance of this vaccine. Patient has been advised to call insurance company to determine out of pocket expense if they have not yet received this vaccine. Advised may also receive vaccine at local pharmacy or Health Dept. Verbalized acceptance and understanding.  Screening Tests Health Maintenance  Topic Date Due   COVID-19 Vaccine (1) Never done   DTaP/Tdap/Td (1 - Tdap) Never done   Colonoscopy  Never done   Pneumonia Vaccine 23+ Years old (1 of 1 - PCV) 06/18/2023 (Originally 09/16/2012)   Zoster Vaccines- Shingrix (1 of 2) 12/25/2023 (Originally 09/16/1964)   INFLUENZA VACCINE  03/12/2023   Medicare Annual Wellness (AWV)  03/02/2024   Hepatitis C Screening  Completed   HPV VACCINES  Aged Out    Health Maintenance  Health Maintenance Due  Topic Date Due   COVID-19 Vaccine (1) Never done   DTaP/Tdap/Td (1 - Tdap) Never done   Colonoscopy  Never done    Colorectal cancer screening: declines at this time  Lung Cancer Screening: (Low Dose CT Chest recommended if Age 19-80 years, 20 pack-year currently smoking OR have quit w/in 15years.) does not qualify.   Lung Cancer Screening Referral: no  Additional Screening:  Hepatitis C Screening: does qualify;  Completed 06/17/2022  Vision Screening: Recommended annual ophthalmology exams for early detection of glaucoma and other disorders of the eye. Is the patient up to date with their annual eye exam?  Yes  Who is the provider or what is the name of the office in which the patient attends annual eye exams? [facility] If pt is not established with a provider, would they like to be referred to a provider to establish care? No .   Dental Screening: Recommended annual dental exams for proper oral hygiene  Diabetic Foot Exam: n/a  Community Resource Referral / Chronic Care Management: CRR required this visit?  No   CCM required this visit?  No     Plan:     I have personally reviewed and noted the following in the patient's chart:   Medical and social history Use of alcohol, tobacco or illicit drugs  Current medications and supplements including opioid prescriptions. Patient is not currently taking opioid prescriptions. Functional ability and status Nutritional status Physical activity Advanced directives List of other physicians Hospitalizations, surgeries, and ER visits in previous 12 months Vitals Screenings  to include cognitive, depression, and falls Referrals and appointments  In addition, I have reviewed and discussed with patient certain preventive protocols, quality metrics, and best practice recommendations. A written personalized care plan for preventive services as well as general preventive health recommendations were provided to patient.     Barb Merino, LPN   [id number]/82/[id number]   After Visit Summary: (MyChart) Due to this being a telephonic visit, the after visit summary with patients personalized plan was offered to patient via MyChart   Nurse Notes: none

## 2023-03-03 NOTE — Patient Instructions (Addendum)
Peter Russell , Thank you for taking time to come for your Medicare Wellness Visit. I appreciate your ongoing commitment to your health goals. Please review the following plan we discussed and let me know if I can assist you in the future.   These are the goals we discussed:  Goals      Patient Stated     02/28/2022, wants to work on losing weight and exercise     Patient Stated     03/03/2023, start walking        This is a list of the screening recommended for you and due dates:  Health Maintenance  Topic Date Due   COVID-19 Vaccine (1) Never done   DTaP/Tdap/Td vaccine (1 - Tdap) Never done   Colon Cancer Screening  Never done   Pneumonia Vaccine (1 of 1 - PCV) 06/18/2023*   Zoster (Shingles) Vaccine (1 of 2) 12/25/2023*   Flu Shot  03/12/2023   Medicare Annual Wellness Visit  03/02/2024   Hepatitis C Screening  Completed   HPV Vaccine  Aged Out  *Topic was postponed. The date shown is not the original due date.    Advanced directives: Advance directive discussed with you today. Even though you declined this today please call our office should you change your mind and we can give you the proper paperwork for you to fill out.  Conditions/risks identified: none  Next appointment: Follow up in one year for your annual wellness visit.   Preventive Care 48 Years and Older, Male  Preventive care refers to lifestyle choices and visits with your health care provider that can promote health and wellness. What does preventive care include? A yearly physical exam. This is also called an annual well check. Dental exams once or twice a year. Routine eye exams. Ask your health care provider how often you should have your eyes checked. Personal lifestyle choices, including: Daily care of your teeth and gums. Regular physical activity. Eating a healthy diet. Avoiding tobacco and drug use. Limiting alcohol use. Practicing safe sex. Taking low doses of aspirin every day. Taking vitamin  and mineral supplements as recommended by your health care provider. What happens during an annual well check? The services and screenings done by your health care provider during your annual well check will depend on your age, overall health, lifestyle risk factors, and family history of disease. Counseling  Your health care provider may ask you questions about your: Alcohol use. Tobacco use. Drug use. Emotional well-being. Home and relationship well-being. Sexual activity. Eating habits. History of falls. Memory and ability to understand (cognition). Work and work Astronomer. Screening  You may have the following tests or measurements: Height, weight, and BMI. Blood pressure. Lipid and cholesterol levels. These may be checked every 5 years, or more frequently if you are over 72 years old. Skin check. Lung cancer screening. You may have this screening every year starting at age 67 if you have a 30-pack-year history of smoking and currently smoke or have quit within the past 15 years. Fecal occult blood test (FOBT) of the stool. You may have this test every year starting at age 22. Flexible sigmoidoscopy or colonoscopy. You may have a sigmoidoscopy every 5 years or a colonoscopy every 10 years starting at age 43. Prostate cancer screening. Recommendations will vary depending on your family history and other risks. Hepatitis C blood test. Hepatitis B blood test. Sexually transmitted disease (STD) testing. Diabetes screening. This is done by checking your blood sugar (glucose)  after you have not eaten for a while (fasting). You may have this done every 1-3 years. Abdominal aortic aneurysm (AAA) screening. You may need this if you are a current or former smoker. Osteoporosis. You may be screened starting at age 110 if you are at high risk. Talk with your health care provider about your test results, treatment options, and if necessary, the need for more tests. Vaccines  Your health care  provider may recommend certain vaccines, such as: Influenza vaccine. This is recommended every year. Tetanus, diphtheria, and acellular pertussis (Tdap, Td) vaccine. You may need a Td booster every 10 years. Zoster vaccine. You may need this after age 84. Pneumococcal 13-valent conjugate (PCV13) vaccine. One dose is recommended after age 65. Pneumococcal polysaccharide (PPSV23) vaccine. One dose is recommended after age 41. Talk to your health care provider about which screenings and vaccines you need and how often you need them. This information is not intended to replace advice given to you by your health care provider. Make sure you discuss any questions you have with your health care provider. Document Released: 08/24/2015 Document Revised: 04/16/2016 Document Reviewed: 05/29/2015 Elsevier Interactive Patient Education  2017 [employer].  Fall Prevention in the Home Falls can cause injuries. They can happen to people of all ages. There are many things you can do to make your home safe and to help prevent falls. What can I do on the outside of my home? Regularly fix the edges of walkways and driveways and fix any cracks. Remove anything that might make you trip as you walk through a door, such as a raised step or threshold. Trim any bushes or trees on the path to your home. Use bright outdoor lighting. Clear any walking paths of anything that might make someone trip, such as rocks or tools. Regularly check to see if handrails are loose or broken. Make sure that both sides of any steps have handrails. Any raised decks and porches should have guardrails on the edges. Have any leaves, snow, or ice cleared regularly. Use sand or salt on walking paths during winter. Clean up any spills in your garage right away. This includes oil or grease spills. What can I do in the bathroom? Use night lights. Install grab bars by the toilet and in the tub and shower. Do not use towel bars as grab  bars. Use non-skid mats or decals in the tub or shower. If you need to sit down in the shower, use a plastic, non-slip stool. Keep the floor dry. Clean up any water that spills on the floor as soon as it happens. Remove soap buildup in the tub or shower regularly. Attach bath mats securely with double-sided non-slip rug tape. Do not have throw rugs and other things on the floor that can make you trip. What can I do in the bedroom? Use night lights. Make sure that you have a light by your bed that is easy to reach. Do not use any sheets or blankets that are too big for your bed. They should not hang down onto the floor. Have a firm chair that has side arms. You can use this for support while you get dressed. Do not have throw rugs and other things on the floor that can make you trip. What can I do in the kitchen? Clean up any spills right away. Avoid walking on wet floors. Keep items that you use a lot in easy-to-reach places. If you need to reach something above you, use a  strong step stool that has a grab bar. Keep electrical cords out of the way. Do not use floor polish or wax that makes floors slippery. If you must use wax, use non-skid floor wax. Do not have throw rugs and other things on the floor that can make you trip. What can I do with my stairs? Do not leave any items on the stairs. Make sure that there are handrails on both sides of the stairs and use them. Fix handrails that are broken or loose. Make sure that handrails are as long as the stairways. Check any carpeting to make sure that it is firmly attached to the stairs. Fix any carpet that is loose or worn. Avoid having throw rugs at the top or bottom of the stairs. If you do have throw rugs, attach them to the floor with carpet tape. Make sure that you have a light switch at the top of the stairs and the bottom of the stairs. If you do not have them, ask someone to add them for you. What else can I do to help prevent  falls? Wear shoes that: Do not have high heels. Have rubber bottoms. Are comfortable and fit you well. Are closed at the toe. Do not wear sandals. If you use a stepladder: Make sure that it is fully opened. Do not climb a closed stepladder. Make sure that both sides of the stepladder are locked into place. Ask someone to hold it for you, if possible. Clearly mark and make sure that you can see: Any grab bars or handrails. First and last steps. Where the edge of each step is. Use tools that help you move around (mobility aids) if they are needed. These include: Canes. Walkers. Scooters. Crutches. Turn on the lights when you go into a dark area. Replace any light bulbs as soon as they burn out. Set up your furniture so you have a clear path. Avoid moving your furniture around. If any of your floors are uneven, fix them. If there are any pets around you, be aware of where they are. Review your medicines with your doctor. Some medicines can make you feel dizzy. This can increase your chance of falling. Ask your doctor what other things that you can do to help prevent falls. This information is not intended to replace advice given to you by your health care provider. Make sure you discuss any questions you have with your health care provider. Document Released: 05/24/2009 Document Revised: 01/03/2016 Document Reviewed: 09/01/2014 Elsevier Interactive Patient Education  2017 [employer].

## 2023-03-18 ENCOUNTER — Other Ambulatory Visit (HOSPITAL_COMMUNITY): Payer: Self-pay | Admitting: Gastroenterology

## 2023-03-18 DIAGNOSIS — R1310 Dysphagia, unspecified: Secondary | ICD-10-CM

## 2023-03-25 ENCOUNTER — Ambulatory Visit (HOSPITAL_COMMUNITY)
Admission: RE | Admit: 2023-03-25 | Discharge: 2023-03-25 | Disposition: A | Discharge status: Home / Self Care | Payer: Medicare Other | Source: Ambulatory Visit | Attending: Gastroenterology | Admitting: Gastroenterology

## 2023-03-25 DIAGNOSIS — R1310 Dysphagia, unspecified: Secondary | ICD-10-CM | POA: Insufficient documentation

## 2023-04-07 ENCOUNTER — Other Ambulatory Visit: Payer: Self-pay | Admitting: Medical

## 2023-04-08 ENCOUNTER — Encounter (HOSPITAL_COMMUNITY)
Admission: RE | Disposition: A | Discharge status: Home / Self Care | Payer: Self-pay | Source: Home / Self Care | Attending: Gastroenterology

## 2023-04-08 ENCOUNTER — Ambulatory Visit (HOSPITAL_COMMUNITY)
Admission: RE | Admit: 2023-04-08 | Discharge: 2023-04-08 | Disposition: A | Discharge status: Home / Self Care | Payer: Medicare Other | Attending: Gastroenterology | Admitting: Gastroenterology

## 2023-04-08 DIAGNOSIS — K220 Achalasia of cardia: Secondary | ICD-10-CM | POA: Insufficient documentation

## 2023-04-08 DIAGNOSIS — R1310 Dysphagia, unspecified: Secondary | ICD-10-CM | POA: Insufficient documentation

## 2023-04-08 HISTORY — PX: ESOPHAGEAL MANOMETRY: SHX5429

## 2023-04-08 SURGERY — MANOMETRY, ESOPHAGUS

## 2023-04-08 MED ORDER — LIDOCAINE VISCOUS HCL 2 % MT SOLN
OROMUCOSAL | Status: AC
  Filled 2023-04-08: qty 15

## 2023-04-08 SURGICAL SUPPLY — 2 items
FACESHIELD LNG OPTICON STERILE (SAFETY) IMPLANT
GLOVE BIO SURGEON STRL SZ8 (GLOVE) ×2 IMPLANT

## 2023-04-08 NOTE — Progress Notes (Signed)
Esophageal Manometry done per protocol. Patient tolerated well without distress or complication.  

## 2023-04-10 ENCOUNTER — Encounter (HOSPITAL_COMMUNITY): Payer: Self-pay | Admitting: Gastroenterology

## 2023-05-08 ENCOUNTER — Telehealth: Payer: Self-pay | Admitting: Cardiology

## 2023-05-08 DIAGNOSIS — I119 Hypertensive heart disease without heart failure: Secondary | ICD-10-CM

## 2023-05-08 DIAGNOSIS — R931 Abnormal findings on diagnostic imaging of heart and coronary circulation: Secondary | ICD-10-CM

## 2023-05-08 DIAGNOSIS — I10 Essential (primary) hypertension: Secondary | ICD-10-CM

## 2023-05-08 NOTE — Telephone Encounter (Signed)
Pt would like to have labs drawn prior to appt in February however I do not see anything order yet. Please advise.

## 2023-05-13 NOTE — Telephone Encounter (Signed)
Left message for patient to callback to schedule lab appointment prior to appt with Dr. Anne Fu in February.  Please schedule lab appointment for fasting CBC, CMP, Lipid panel 1-2 weeks prior to appointment with Dr. Anne Fu. Lab orders in Epic.

## 2023-05-19 NOTE — Telephone Encounter (Signed)
Message sent to pt via MyChart to call to schedule fasting blood work.

## 2023-05-23 ENCOUNTER — Other Ambulatory Visit: Payer: Self-pay | Admitting: Medical

## 2023-05-25 ENCOUNTER — Telehealth: Payer: Self-pay

## 2023-05-25 MED ORDER — VALSARTAN-HYDROCHLOROTHIAZIDE 160-25 MG PO TABS: ORAL_TABLET | ORAL | 0 refills | Status: DC

## 2023-05-25 NOTE — Telephone Encounter (Signed)
refilled 

## 2023-05-25 NOTE — Telephone Encounter (Signed)
Left message for pt to call back to schedule a visit as he is due

## 2023-05-25 NOTE — Telephone Encounter (Signed)
Pt physical scheduled for 07/27/23 so he can get his medications refilled.

## 2023-07-12 ENCOUNTER — Other Ambulatory Visit: Payer: Self-pay | Admitting: Medical

## 2023-07-12 ENCOUNTER — Other Ambulatory Visit: Payer: Self-pay | Admitting: Cardiology

## 2023-07-13 ENCOUNTER — Encounter: Payer: Self-pay | Admitting: Internal Medicine

## 2023-07-16 ENCOUNTER — Other Ambulatory Visit: Payer: Self-pay | Admitting: Cardiology

## 2023-08-13 ENCOUNTER — Other Ambulatory Visit: Payer: Self-pay | Admitting: Medical

## 2023-08-23 ENCOUNTER — Other Ambulatory Visit: Payer: Self-pay | Admitting: Medical

## 2023-08-25 ENCOUNTER — Ambulatory Visit: Payer: Medicare Other | Admitting: Medical

## 2023-08-25 ENCOUNTER — Telehealth (INDEPENDENT_AMBULATORY_CARE_PROVIDER_SITE_OTHER): Payer: Self-pay | Admitting: Otolaryngology

## 2023-08-25 VITALS — BP 122/70 | HR 66 | Ht 69.5 in | Wt 188.8 lb

## 2023-08-25 DIAGNOSIS — Z7189 Other specified counseling: Secondary | ICD-10-CM

## 2023-08-25 DIAGNOSIS — R7301 Impaired fasting glucose: Secondary | ICD-10-CM | POA: Insufficient documentation

## 2023-08-25 DIAGNOSIS — I10 Essential (primary) hypertension: Secondary | ICD-10-CM

## 2023-08-25 DIAGNOSIS — N1831 Chronic kidney disease, stage 3a: Secondary | ICD-10-CM

## 2023-08-25 DIAGNOSIS — G4733 Obstructive sleep apnea (adult) (pediatric): Secondary | ICD-10-CM

## 2023-08-25 DIAGNOSIS — Z8546 Personal history of malignant neoplasm of prostate: Secondary | ICD-10-CM

## 2023-08-25 DIAGNOSIS — Z2820 Immunization not carried out because of patient decision for unspecified reason: Secondary | ICD-10-CM | POA: Diagnosis not present

## 2023-08-25 DIAGNOSIS — I700 Atherosclerosis of aorta: Secondary | ICD-10-CM | POA: Insufficient documentation

## 2023-08-25 DIAGNOSIS — K220 Achalasia of cardia: Secondary | ICD-10-CM

## 2023-08-25 LAB — LIPID PANEL

## 2023-08-25 MED ORDER — AMLODIPINE BESYLATE 5 MG PO TABS: 5.0000 mg | ORAL_TABLET | Freq: Every day | ORAL | 2 refills | Status: DC

## 2023-08-25 MED ORDER — ROSUVASTATIN CALCIUM 20 MG PO TABS: 20.0000 mg | ORAL_TABLET | Freq: Every day | ORAL | 2 refills | Status: DC

## 2023-08-25 MED ORDER — VALSARTAN-HYDROCHLOROTHIAZIDE 160-25 MG PO TABS: ORAL_TABLET | ORAL | 2 refills | Status: DC

## 2023-08-25 NOTE — Progress Notes (Signed)
 Subjective: Chief Complaint  Patient presents with   Annual Exam    Fasting cpe, no concerns, declines alls shots today, declines colonoscopy- never had one, never wants one   Here for med check  Patient Care Team: Loyalty Brashier, Alm RAMAN, PA-C as PCP - General (Family Medicine) Alvaro, Ricardo KATHEE Raddle., MD as Consulting Physician (Urology) Karis Daniel Motts, MD as Referring Physician (Otolaryngology) Francois, Darin Lyn, MD as Referring Physician (Gastroenterology) Jeffrie Oneil BROCKS, MD as Consulting Physician (Cardiology) Dentist, Dr. Morris Eye doctor yearly, Dr. Octavia    Concerns: Here for physical/well visit  Seeing [facility] within the next few weeks for procedure to help with achalasia.  Hypertension-compliant with amlodipine  5 mg daily, losartan HCT 160/25 mg daily  Hyperlipidemia-compliant with Crestor  rosuvastatin  20 mg daily without complaint  He takes some vitamins daily.  Sees urology, on Xtandi for history of prostate cancer  OSA - uses CPAP regularly  Saw Dr. Octavia this past year.  Sees ear doctor tomorrow, wears hearing aids.  Fasting today for labs.  Widowed, wife died in 08/08/22.  Son in [place], one in [place] , daughter in [place] .  Siblings have passed away.  Hasn't seen one of his sons in 25 years, but seeing [place] in [place].  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   Hasn't completed advanced directives.  ROS as in subjective   Past Medical History:  Diagnosis Date   Arthritis    sternum and knees   CKD (chronic kidney disease) stage 3, GFR 30-59 ml/min (HCC) 2020   Hypertension    Prostate cancer (HCC)    Current Outpatient Medications on File Prior to Visit  Medication Sig Dispense Refill   ascorbic Acid (VITAMIN C) 500 MG CPCR Take by mouth.     B Complex-Biotin-FA (B-50 COMPLEX PO) Take by mouth.     calcium -vitamin D (OSCAL WITH D) 500-5 MG-MCG tablet Take 1 tablet by mouth.     XTANDI 40 MG  capsule Take 80 mg by mouth daily.     No current facility-administered medications on file prior to visit.     Objective: BP 122/70   Pulse 66   Ht 5' 9.5 (1.765 m)   Wt 188 lb 12.8 oz (85.6 kg)   BMI 27.48 kg/m   BP Readings from Last 3 Encounters:  08/25/23 122/70  03/03/23 124/68  02/20/23 136/78   Wt Readings from Last 3 Encounters:  08/25/23 188 lb 12.8 oz (85.6 kg)  03/03/23 210 lb 6.4 oz (95.4 kg)  02/20/23 217 lb 9.6 oz (98.7 kg)   General appearence: alert, no distress, WD/WN,  Neck: supple, no lymphadenopathy, no thyromegaly, no masses, no JVD Heart: RRR, normal S1, S2, no murmurs Lungs: CTA bilaterally, no wheezes, rhonchi, or rales Pulses: 2+ symmetric, upper and lower extremities, normal cap refill Ext: no edema Abdomen: +bs soft, nontender, no mass, no organomegaly    Assessment: Encounter Diagnoses  Name Primary?   Essential hypertension, benign Yes   Chronic kidney disease, stage 3a (HCC)    Advance directive discussed with patient    Vaccine refused by patient    OSA (obstructive sleep apnea)    Aortic atherosclerosis (HCC)    Impaired fasting blood sugar    History of prostate cancer       Plan: Hypertension -continue amlodipine  5 mg daily, valsartan  HCT 160/25 mg daily  History of prostate cancer - due for repeat bone scan this spring  2025 with urology.   He continues on Xtandi  CKD 3a -updated labs today  OSA - continue CPAP  Aortic atherosclerosis, hyperlipidemia-continue Crestor  rosuvastatin  20 mg daily  Impaired glucose-lab screening today for diabetes  Counseled on advanced directives and advised he work to complete these  We discussed recommended vaccines.  He declines vaccines in general  Achalasia -I wished him well on his upcoming procedure for achalasia within the next 2 weeks at [facility] was seen today for annual exam.  Diagnoses and all orders for this visit:  Essential hypertension, benign -      Comprehensive metabolic panel -     Lipid panel -     Urinalysis, Routine w reflex microscopic  Chronic kidney disease, stage 3a (HCC) -     Comprehensive metabolic panel -     Urinalysis, Routine w reflex microscopic  Advance directive discussed with patient  Vaccine refused by patient  OSA (obstructive sleep apnea)  Aortic atherosclerosis (HCC) -     Lipid panel  Impaired fasting blood sugar -     Hemoglobin A1c  History of prostate cancer -     CBC with Differential/Platelet -     Lactate dehydrogenase -     Urinalysis, Routine w reflex microscopic  Other orders -     amLODipine  (NORVASC ) 5 MG tablet; Take 1 tablet (5 mg total) by mouth daily. -     valsartan -hydrochlorothiazide  (DIOVAN -HCT) 160-25 MG tablet; Take 1 tablet by mouth once daily -     rosuvastatin  (CRESTOR ) 20 MG tablet; Take 1 tablet (20 mg total) by mouth daily.    Spent > 45 minutes face to face with patient in discussion of symptoms, evaluation, plan and recommendations.    F/u pending labs

## 2023-08-25 NOTE — Telephone Encounter (Signed)
 Confirmed appt & location [id number] afm

## 2023-08-26 ENCOUNTER — Encounter (INDEPENDENT_AMBULATORY_CARE_PROVIDER_SITE_OTHER): Payer: Self-pay

## 2023-08-26 ENCOUNTER — Other Ambulatory Visit: Payer: Self-pay | Admitting: Medical

## 2023-08-26 ENCOUNTER — Ambulatory Visit (INDEPENDENT_AMBULATORY_CARE_PROVIDER_SITE_OTHER): Payer: BC Managed Care – PPO | Admitting: Audiology

## 2023-08-26 ENCOUNTER — Ambulatory Visit (INDEPENDENT_AMBULATORY_CARE_PROVIDER_SITE_OTHER): Payer: Medicare Other

## 2023-08-26 VITALS — BP 153/84 | HR 68 | Ht 70.0 in | Wt 184.0 lb

## 2023-08-26 DIAGNOSIS — H6123 Impacted cerumen, bilateral: Secondary | ICD-10-CM

## 2023-08-26 DIAGNOSIS — H903 Sensorineural hearing loss, bilateral: Secondary | ICD-10-CM | POA: Diagnosis not present

## 2023-08-26 LAB — CBC WITH DIFFERENTIAL/PLATELET
Basophils Absolute: 0.1 10*3/uL (ref 0.0–0.2)
Basos: 1 %
EOS (ABSOLUTE): 0.2 10*3/uL (ref 0.0–0.4)
Eos: 3 %
Hematocrit: 39.1 % (ref 37.5–51.0)
Hemoglobin: 12.6 g/dL — ABNORMAL LOW (ref 13.0–17.7)
Immature Grans (Abs): 0 10*3/uL (ref 0.0–0.1)
Immature Granulocytes: 0 %
Lymphocytes Absolute: 1.7 10*3/uL (ref 0.7–3.1)
Lymphs: 18 %
MCH: 28.4 pg (ref 26.6–33.0)
MCHC: 32.2 g/dL (ref 31.5–35.7)
MCV: 88 fL (ref 79–97)
Monocytes Absolute: 0.6 10*3/uL (ref 0.1–0.9)
Monocytes: 7 %
Neutrophils Absolute: 6.8 10*3/uL (ref 1.4–7.0)
Neutrophils: 71 %
Platelets: 341 10*3/uL (ref 150–450)
RBC: 4.43 x10E6/uL (ref 4.14–5.80)
RDW: 15 % (ref 11.6–15.4)
WBC: 9.4 10*3/uL (ref 3.4–10.8)

## 2023-08-26 LAB — COMPREHENSIVE METABOLIC PANEL
ALT: 10 IU/L (ref 0–44)
AST: 20 IU/L (ref 0–40)
Albumin: 4.2 g/dL (ref 3.8–4.8)
Alkaline Phosphatase: 76 IU/L (ref 44–121)
BUN/Creatinine Ratio: 16 (ref 10–24)
BUN: 18 mg/dL (ref 8–27)
Bilirubin Total: 0.4 mg/dL (ref 0.0–1.2)
CO2: 25 mmol/L (ref 20–29)
Calcium: 9.7 mg/dL (ref 8.6–10.2)
Chloride: 102 mmol/L (ref 96–106)
Creatinine, Ser: 1.1 mg/dL (ref 0.76–1.27)
Globulin, Total: 2.6 g/dL (ref 1.5–4.5)
Glucose: 92 mg/dL (ref 70–99)
Potassium: 4.5 mmol/L (ref 3.5–5.2)
Sodium: 142 mmol/L (ref 134–144)
Total Protein: 6.8 g/dL (ref 6.0–8.5)
eGFR: 70 mL/min/{1.73_m2} (ref >59)

## 2023-08-26 LAB — LACTATE DEHYDROGENASE: LDH: 203 IU/L (ref 121–224)

## 2023-08-26 LAB — LIPID PANEL
Cholesterol, Total: 136 mg/dL (ref 100–199)
HDL: 53 mg/dL (ref >39)
LDL CALC COMMENT:: 2.6 ratio (ref 0.0–5.0)
LDL Chol Calc (NIH): 58 mg/dL (ref 0–99)
Triglycerides: 148 mg/dL (ref 0–149)
VLDL Cholesterol Cal: 25 mg/dL (ref 5–40)

## 2023-08-26 LAB — HEMOGLOBIN A1C
Est. average glucose Bld gHb Est-mCnc: 120 mg/dL
Hgb A1c MFr Bld: 5.8 % — ABNORMAL HIGH (ref 4.8–5.6)

## 2023-08-26 NOTE — Progress Notes (Signed)
Patient ID: Peter Russell, male   DOB: Dec 23, 1945, 78 y.o.   MRN: [id number]  Cc: Hearing loss  HPI: The patient is a 78 year old male who returns today for his follow-up evaluation.  He was last seen 1 year ago.  At that time, he was noted to have bilateral high-frequency sensorineural hearing loss.  He was fitted with bilateral hearing aids.  The patient returns today reporting no significant change in his hearing.  The hearing aids have helped.  He has noted clogging sensation in his ears lately.  He denies any otalgia, otorrhea, or vertigo.  Exam: General: Communicates without difficulty, well nourished, no acute distress. Head: Normocephalic, no evidence injury, no tenderness, facial buttresses intact without stepoff. Face/sinus: No tenderness to palpation and percussion. Facial movement is normal and symmetric. Eyes: PERRL, EOMI. No scleral icterus, conjunctivae clear. Neuro: CN II exam reveals vision grossly intact.  No nystagmus at any point of gaze. Ears: Auricles well formed without lesions.  Bilateral cerumen impaction.  Nose: External evaluation reveals normal support and skin without lesions.  Dorsum is intact.  Anterior rhinoscopy reveals congested mucosa over anterior aspect of inferior turbinates and intact septum.  No purulence noted. Oral:  Oral cavity and oropharynx are intact, symmetric, without erythema or edema.  Mucosa is moist without lesions. Neck: Full range of motion without pain.  There is no significant lymphadenopathy.  No masses palpable.  Thyroid bed within normal limits to palpation.  Parotid glands and submandibular glands equal bilaterally without mass.  Trachea is midline. Neuro:  CN 2-12 grossly intact.   Procedure: Bilateral cerumen disimpaction Anesthesia: None Description: Under the operating microscope, the cerumen is carefully removed with a combination of cerumen currette, alligator forceps, and suction catheters.  After the cerumen is removed, the TMs are noted to  be normal.  No mass, erythema, or lesions. The patient tolerated the procedure well.    Assessment: 1.  Bilateral cerumen impaction.  After the disimpaction procedure, both tympanic membranes and middle ear spaces are noted to be normal. 2.  Subjectively stable bilateral high-frequency sensorineural hearing loss.  Plan: 1.  Otomicroscopy with bilateral cerumen disimpaction. 2.  Continue to use of his hearing aids. 3.  The patient will see the audiologist for repeat hearing test and possible hearing aid adjustment. 4.  The patient will return for reevaluation in 1 year, sooner if needed.

## 2023-08-26 NOTE — Progress Notes (Signed)
 Results sent through MyChart

## 2023-08-27 DIAGNOSIS — H6123 Impacted cerumen, bilateral: Secondary | ICD-10-CM | POA: Insufficient documentation

## 2023-08-27 DIAGNOSIS — H903 Sensorineural hearing loss, bilateral: Secondary | ICD-10-CM | POA: Insufficient documentation

## 2023-08-27 NOTE — Addendum Note (Signed)
Addended byNewman Pies on: 08/27/2023 07:47 AM   Modules accepted: Orders

## 2023-08-28 ENCOUNTER — Telehealth (INDEPENDENT_AMBULATORY_CARE_PROVIDER_SITE_OTHER): Payer: Self-pay | Admitting: Audiology

## 2023-08-28 NOTE — Telephone Encounter (Signed)
LVM to confirm appt & location [id number] afm

## 2023-08-31 ENCOUNTER — Ambulatory Visit (INDEPENDENT_AMBULATORY_CARE_PROVIDER_SITE_OTHER): Payer: Medicare Other | Admitting: Audiology

## 2023-08-31 ENCOUNTER — Ambulatory Visit (INDEPENDENT_AMBULATORY_CARE_PROVIDER_SITE_OTHER): Payer: Self-pay | Admitting: Audiology

## 2023-08-31 DIAGNOSIS — H903 Sensorineural hearing loss, bilateral: Secondary | ICD-10-CM

## 2023-08-31 NOTE — Progress Notes (Signed)
  [address], [address] [place], [place] [address] [phone]  Audiological Evaluation    Name: Peter Russell     DOB:   October 02, 1945      MRN:   [id number]                                                                                     Service Date: 08/31/2023     Accompanied by: unaccompanied    Patient comes today after Dr. Suszanne Conners, ENT sent a referral for a hearing evaluation due to concerns with hearing loss.   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  Previous ear surgeries  []    Family history  []    Amplification  [x]  Has a set of Oticon hearing aids that were fit at Dr. [facility].  Other  []      Otoscopy: Right ear: Clear external ear canals and notable landmarks visualized on the tympanic membrane. Left ear:  Clear external ear canals and notable landmarks visualized on the tympanic membrane.  Tympanometry: Right ear: Type A- Normal external ear canal volume with normal middle ear pressure and tympanic membrane compliance Left ear: Type A- Normal external ear canal volume with normal middle ear pressure and tympanic membrane compliance     Pure tone Audiometry:  Normal sloping to severe sensorineural hearing loss from 250 Hz - 8000 Hz, in both ears.    The hearing test results were completed under headphones and results are deemed to be of good reliability. Test technique:  conventional     Speech Audiometry: Right ear- Speech Reception Threshold (SRT) was obtained at 35 dBHL Left ear-Speech Reception Threshold (SRT) was obtained at 35 dBHL   Word Recognition Score Tested using NU-6 (MLV) Right ear: 76% was obtained at a presentation level of 85 dBHL with contralateral masking  which is deemed as  fair Left ear: 80% was obtained at a presentation level of 85 dBHL with contralateral masking which is deemed as  good     Impression: There was a significant decline in pure-tone thresholds today when compared to the previous audiogram on file.   Recommendations: Follow up with ENT as scheduled for today. Return for a hearing evaluation if concerns with hearing changes arise or per MD recommendation. Recommend not using the hearing aids when it is damaging/loud around him.   Stepan Verrette MARIE LEROUX-MARTINEZ, AUD

## 2023-09-01 NOTE — Progress Notes (Signed)
  [address], [address] [place], [place] [address] [phone]  Hearing Aid Check     Peter Russell comes for a scheduled appointment for a hearing aid check.  Time in:2:34pm Time out:2:48pm Accompanied by: unaccompanied    Right Left  Hearing aid manufacturer Oticon Real 2 miniRITE-R SN:[id number] Oticon Real 2 miniRITE-R SN:[id number]  Hearing aid style Receiver-in-the-canal Receiver-in-the-canal  Hearing aid Technical brewer  Receiver 3-85 3-85  Dome/ custom earpiece 8mm open dome 8mm double dome (power)  Retention wire with kickstand with kickstand  Warranty expiration date 10-03-2025 10-03-2025  Loss and Damage 10-03-2025 10-03-2025  Additional accessories Expiration date Remote control : 10-04-23 Smart charger: [id number]  Initial fitting date 09-05-2022  [facility]- services bundled while hearing aids are under warranty    Chief complaint: Patient reports he wants to have his hearing aids cleaned.  Actions taken: Inspection of the device and listening check showed that they were working well. Both microphone ports, wax filters and domes were cleaned or replaced with a new one. How to change the wax filter was briefly reviewed.  Services fee: $0 was paid at checkout.  Recommend: Return for a hearing aid check , as needed. Return for a hearing evaluation and to see an ENT, if concerns with hearing changes arise.    Brianda Beitler MARIE LEROUX-MARTINEZ, AUD

## 2023-09-23 ENCOUNTER — Other Ambulatory Visit: Payer: BC Managed Care – PPO

## 2023-09-23 ENCOUNTER — Other Ambulatory Visit: Payer: Self-pay

## 2023-09-23 DIAGNOSIS — I10 Essential (primary) hypertension: Secondary | ICD-10-CM

## 2023-09-23 DIAGNOSIS — I119 Hypertensive heart disease without heart failure: Secondary | ICD-10-CM

## 2023-09-23 DIAGNOSIS — R931 Abnormal findings on diagnostic imaging of heart and coronary circulation: Secondary | ICD-10-CM

## 2023-09-23 DIAGNOSIS — Z79899 Other long term (current) drug therapy: Secondary | ICD-10-CM

## 2023-09-23 NOTE — Progress Notes (Signed)
Orders placed and released to [facility] per Dr Anne Fu 10/24 for CBC, CMET and Lipid panel

## 2023-09-24 LAB — CBC WITH DIFFERENTIAL/PLATELET
Basophils Absolute: 0.1 10*3/uL (ref 0.0–0.2)
Basos: 0 %
EOS (ABSOLUTE): 0.2 10*3/uL (ref 0.0–0.4)
Eos: 1 %
Hematocrit: 35.9 % — ABNORMAL LOW (ref 37.5–51.0)
Hemoglobin: 11.6 g/dL — ABNORMAL LOW (ref 13.0–17.7)
Immature Grans (Abs): 0.1 10*3/uL (ref 0.0–0.1)
Immature Granulocytes: 1 %
Lymphocytes Absolute: 1.2 10*3/uL (ref 0.7–3.1)
Lymphs: 10 %
MCH: 28.2 pg (ref 26.6–33.0)
MCHC: 32.3 g/dL (ref 31.5–35.7)
MCV: 87 fL (ref 79–97)
Monocytes Absolute: 0.8 10*3/uL (ref 0.1–0.9)
Monocytes: 7 %
Neutrophils Absolute: 10.2 10*3/uL — ABNORMAL HIGH (ref 1.4–7.0)
Neutrophils: 81 %
Platelets: 493 10*3/uL — ABNORMAL HIGH (ref 150–450)
RBC: 4.12 x10E6/uL — ABNORMAL LOW (ref 4.14–5.80)
RDW: 14.5 % (ref 11.6–15.4)
WBC: 12.5 10*3/uL — ABNORMAL HIGH (ref 3.4–10.8)

## 2023-09-24 LAB — LIPID PANEL
Chol/HDL Ratio: 3.2 {ratio} (ref 0.0–5.0)
Cholesterol, Total: 121 mg/dL (ref 100–199)
HDL: 38 mg/dL — ABNORMAL LOW (ref >39)
LDL Chol Calc (NIH): 63 mg/dL (ref 0–99)
Triglycerides: 108 mg/dL (ref 0–149)
VLDL Cholesterol Cal: 20 mg/dL (ref 5–40)

## 2023-09-24 LAB — COMPREHENSIVE METABOLIC PANEL
ALT: 16 [IU]/L (ref 0–44)
AST: 21 [IU]/L (ref 0–40)
Albumin: 3.4 g/dL — ABNORMAL LOW (ref 3.8–4.8)
Alkaline Phosphatase: 111 [IU]/L (ref 44–121)
BUN/Creatinine Ratio: 18 (ref 10–24)
BUN: 29 mg/dL — ABNORMAL HIGH (ref 8–27)
Bilirubin Total: 0.4 mg/dL (ref 0.0–1.2)
CO2: 19 mmol/L — ABNORMAL LOW (ref 20–29)
Calcium: 9.3 mg/dL (ref 8.6–10.2)
Chloride: 98 mmol/L (ref 96–106)
Creatinine, Ser: 1.63 mg/dL — ABNORMAL HIGH (ref 0.76–1.27)
Globulin, Total: 4.1 g/dL (ref 1.5–4.5)
Glucose: 123 mg/dL — ABNORMAL HIGH (ref 70–99)
Potassium: 5 mmol/L (ref 3.5–5.2)
Sodium: 137 mmol/L (ref 134–144)
Total Protein: 7.5 g/dL (ref 6.0–8.5)
eGFR: 43 mL/min/{1.73_m2} — ABNORMAL LOW (ref >59)

## 2023-09-25 ENCOUNTER — Encounter: Payer: Self-pay | Admitting: Cardiology

## 2023-09-28 ENCOUNTER — Encounter: Payer: Self-pay | Admitting: Cardiology

## 2023-09-28 ENCOUNTER — Ambulatory Visit: Payer: Medicare Other | Attending: Cardiology | Admitting: Cardiology

## 2023-09-28 VITALS — BP 118/62 | HR 71 | Ht 70.0 in

## 2023-09-28 DIAGNOSIS — I2510 Atherosclerotic heart disease of native coronary artery without angina pectoris: Secondary | ICD-10-CM | POA: Insufficient documentation

## 2023-09-28 DIAGNOSIS — R931 Abnormal findings on diagnostic imaging of heart and coronary circulation: Secondary | ICD-10-CM | POA: Insufficient documentation

## 2023-09-28 DIAGNOSIS — I119 Hypertensive heart disease without heart failure: Secondary | ICD-10-CM | POA: Insufficient documentation

## 2023-09-28 NOTE — Progress Notes (Signed)
Cardiology Office Note:  .   Date:  09/28/2023  ID:  Peter Russell, DOB Oct 12, 1945, MRN [id number] PCP: Genia Del  [facility] Providers Cardiologist:  None     History of Present Illness: .   Peter Russell is a 78 y.o. male Discussed with the use of AI scribe   History of Present Illness   Peter Russell is a 78 year old male with elevated coronary calcium score and hyperlipidemia who presents for follow-up.  He has an elevated coronary calcium score and hyperlipidemia. He is on Crestor 20 mg daily, which has successfully reduced his LDL to 54 mg/dL as of 2956. His coronary calcium score was 83 in 2022, placing him in the 35th percentile. He also has a history of aortic atherosclerosis and previously noted PVCs on ECG with atypical chest pain. No current cardiac symptoms or chest pain are reported.  He is currently on amlodipine 5 mg daily and valsartan/hydrochlorothiazide 160/25 mg for blood pressure management.  He recently underwent a POEM procedure for achalasia of the esophagus. Post-procedure, he has been on a liquid diet for six days and has transitioned to a soft food diet. He experienced tiredness and weakness on the days following the procedure, with a sore throat that has since resolved. He feels better today, though still tired, and is now able to eat soft foods like scrambled eggs.  He has chronic kidney disease stage 3A, with a creatinine level of 1.3 mg/dL. He occasionally takes Tylenol for headaches and NyQuil for cough, though he was advised against NyQuil immediately post-procedure.  No current cardiac symptoms or chest pain. He feels tired but not weak today.         Studies Reviewed: Marland Kitchen   EKG Interpretation Date/Time:  Monday September 28 2023 15:36:17 EST Ventricular Rate:  71 PR Interval:  128 QRS Duration:  88 QT Interval:  398 QTC Calculation: 432 R Axis:   71  Text Interpretation: Normal sinus rhythm Normal ECG No previous ECGs  available Confirmed by Donato Schultz ([id number]) on 09/28/2023 3:43:35 PM    Results   LABS LDL: 54 (2024) HbA1c: 5.4 Cr: 1.0 Hb: 14 ALT: 5 Cr: 1.3 Hb: 12.6  RADIOLOGY Coronary Calcium Score: 83 (2022)  DIAGNOSTIC ECG: PVCs     Risk Assessment/Calculations:            Physical Exam:   VS:  BP 118/62   Pulse 71   Ht 5\' 10"  (1.778 m)   SpO2 96%   BMI 26.40 kg/m    Wt Readings from Last 3 Encounters:  08/26/23 184 lb (83.5 kg)  08/25/23 188 lb 12.8 oz (85.6 kg)  03/03/23 210 lb 6.4 oz (95.4 kg)    GEN: Well nourished, well developed in no acute distress NECK: No JVD; No carotid bruits CARDIAC: RRR, no murmurs, no rubs, no gallops RESPIRATORY:  Clear to auscultation without rales, wheezing or rhonchi  ABDOMEN: Soft, non-tender, non-distended EXTREMITIES:  No edema; No deformity   ASSESSMENT AND PLAN: .    Assessment and Plan    Coronary Artery Calcification Well-controlled coronary artery calcification with a coronary calcium score of 83 in 2022 (35th percentile). Asymptomatic with no new cardiac symptoms. LDL cholesterol well-controlled on Crestor 20 mg daily (current LDL 54). - Continue Crestor 20 mg daily - No new imaging required at this time - PRN follow-up if symptoms develop  Aortic Atherosclerosis Well-controlled aortic atherosclerosis. No new symptoms reported. Blood pressure well-managed on current medications. Discussed the  importance of blood pressure control and medication adherence to prevent progression. - Continue current medications: Amlodipine 5 mg daily, Valsartan/Hydrochlorothiazide 160/25 mg daily - Monitor blood pressure regularly  Chronic Kidney Disease Stage 3A Chronic kidney disease stage 3A with creatinine 1.3. Condition is stable and not currently debilitating. Discussed the importance of avoiding nephrotoxic medications and maintaining good hydration to prevent progression. - Maintain good hydration - Avoid NSAIDs (e.g., Advil,  ibuprofen) - Use Tylenol for pain management if needed - Monitor kidney function regularly  Achalasia (Post-POEM Procedure) Post-POEM procedure for achalasia with current dietary restrictions (soft food diet for two weeks). Reports improvement in symptoms with some residual tiredness and sore throat initially, now resolved. Discussed the potential for esophageal pain to mimic cardiac pain. - Continue soft food diet for two weeks - Monitor for any new symptoms or complications  General Health Maintenance General health maintenance discussed, including the importance of blood pressure control and medication adherence. - Continue current medications - Regular monitoring of blood pressure and kidney function - Follow up with primary care provider as needed  Follow-up - PRN follow-up with cardiology if symptoms develop - Primary care provider to manage ongoing medications and monitor health status. -OK to graduate from cardiology clinic: continue Crestor 20mg . LDL at goal.              Signed, Donato Schultz, MD

## 2023-09-28 NOTE — Patient Instructions (Signed)
 Medication Instructions:  The current medical regimen is effective;  continue present plan and medications.  *If you need a refill on your cardiac medications before your next appointment, please call your pharmacy*  Follow-Up: At [facility], you and your health needs are our priority.  As part of our continuing mission to provide you with exceptional heart care, we have created designated Provider Care Teams.  These Care Teams include your primary Cardiologist (physician) and Advanced Practice Providers (APPs -  Physician Assistants and Nurse Practitioners) who all work together to provide you with the care you need, when you need it.  We recommend signing up for the patient portal called "MyChart".  Sign up information is provided on this After Visit Summary.  MyChart is used to connect with patients for Virtual Visits (Telemedicine).  Patients are able to view lab/test results, encounter notes, upcoming appointments, etc.  Non-urgent messages can be sent to your provider as well.   To learn more about what you can do with MyChart, go to [email or web address].    Your next appointment:   Follow up as needed with Dr Anne Fu

## 2023-09-29 ENCOUNTER — Encounter: Payer: Self-pay | Admitting: Internal Medicine

## 2023-09-29 NOTE — Progress Notes (Signed)
Call patient.   I had a recent blood test ordered to me where his kidney marker has bumped up.  At his recent visit here I believe the kidney marker was pretty good. We may need to go ahead and see him back.  I need to make sure he is drinking at least 80 to 100 ounces of water daily.  Due to abnormal kidney function, and in order to protect your kidneys, I recommend you avoid medications that can harm the kidneys such as ibuprofen, Aleve, Advil, Motrin, Naprosyn, or prescription anti-inflammatories which are used for pain, inflammation, and arthritis.   You should avoid dehydration which can harm the kidneys.  You can certainly develop some kidney disease with history of high blood pressure or other issues.  Does he feel like he has had some recent dehydration or recent illness?

## 2023-11-24 ENCOUNTER — Other Ambulatory Visit (HOSPITAL_COMMUNITY): Payer: Self-pay | Admitting: Urology

## 2023-11-24 DIAGNOSIS — C61 Malignant neoplasm of prostate: Secondary | ICD-10-CM

## 2023-11-24 DIAGNOSIS — C7951 Secondary malignant neoplasm of bone: Secondary | ICD-10-CM

## 2023-12-03 ENCOUNTER — Encounter (HOSPITAL_COMMUNITY)
Admission: RE | Admit: 2023-12-03 | Discharge: 2023-12-03 | Disposition: A | Discharge status: Home / Self Care | Source: Ambulatory Visit | Attending: Urology | Admitting: Urology

## 2023-12-03 ENCOUNTER — Ambulatory Visit (HOSPITAL_COMMUNITY)
Admission: RE | Admit: 2023-12-03 | Discharge: 2023-12-03 | Disposition: A | Discharge status: Home / Self Care | Source: Ambulatory Visit | Attending: Urology | Admitting: Urology

## 2023-12-03 DIAGNOSIS — C61 Malignant neoplasm of prostate: Secondary | ICD-10-CM

## 2023-12-03 DIAGNOSIS — C7951 Secondary malignant neoplasm of bone: Secondary | ICD-10-CM | POA: Diagnosis present

## 2023-12-03 LAB — POCT I-STAT CREATININE: Creatinine, Ser: 1.4 mg/dL — ABNORMAL HIGH (ref 0.61–1.24)

## 2023-12-03 MED ORDER — TECHNETIUM TC 99M MEDRONATE IV KIT
20.0000 | PACK | Freq: Once | INTRAVENOUS | Status: AC | PRN
  Administered 2023-12-03: 20 via INTRAVENOUS

## 2023-12-03 MED ORDER — IOHEXOL 300 MG/ML  SOLN
100.0000 mL | Freq: Once | INTRAMUSCULAR | Status: AC | PRN
  Administered 2023-12-03: 100 mL via INTRAVENOUS

## 2024-01-18 ENCOUNTER — Other Ambulatory Visit: Payer: Self-pay | Admitting: Medical

## 2024-01-18 MED ORDER — ROSUVASTATIN CALCIUM 20 MG PO TABS: 20.0000 mg | ORAL_TABLET | Freq: Every day | ORAL | 1 refills | Status: DC

## 2024-03-01 ENCOUNTER — Ambulatory Visit (INDEPENDENT_AMBULATORY_CARE_PROVIDER_SITE_OTHER): Admitting: Medical

## 2024-03-01 ENCOUNTER — Encounter: Payer: Self-pay | Admitting: Medical

## 2024-03-01 ENCOUNTER — Ambulatory Visit
Admission: RE | Admit: 2024-03-01 | Discharge: 2024-03-01 | Disposition: A | Discharge status: Home / Self Care | Source: Ambulatory Visit | Attending: Medical | Admitting: Medical

## 2024-03-01 VITALS — BP 128/78 | HR 78 | Ht 69.0 in | Wt 209.6 lb

## 2024-03-01 DIAGNOSIS — M1A9XX0 Chronic gout, unspecified, without tophus (tophi): Secondary | ICD-10-CM

## 2024-03-01 DIAGNOSIS — G8929 Other chronic pain: Secondary | ICD-10-CM

## 2024-03-01 DIAGNOSIS — I10 Essential (primary) hypertension: Secondary | ICD-10-CM

## 2024-03-01 DIAGNOSIS — M25562 Pain in left knee: Secondary | ICD-10-CM

## 2024-03-01 DIAGNOSIS — G4733 Obstructive sleep apnea (adult) (pediatric): Secondary | ICD-10-CM

## 2024-03-01 DIAGNOSIS — N1832 Chronic kidney disease, stage 3b: Secondary | ICD-10-CM | POA: Diagnosis not present

## 2024-03-01 DIAGNOSIS — R7303 Prediabetes: Secondary | ICD-10-CM

## 2024-03-01 DIAGNOSIS — M25561 Pain in right knee: Secondary | ICD-10-CM

## 2024-03-01 NOTE — Patient Instructions (Signed)
 OSA - refer to updated sleep study to be ready in case his CPAP stops working . Currenlty using CPAP nightly.  Hyperlipidemia - continue with rosuvastatin  Crestor  20mg  daily.  Last lipids 09/2023 with LDL 63, HDL 38 though.  Hypertension-continue current medications amlodipine  5 mg daily and valsartan  HCT 160/25 mg daily.  CKD 3b Chronic kidney disease stage IIIb Hydrate with 80 to 100 ounces of water daily.  Avoid dehydration  History of gout, elevated uric acid-updated labs today  Prediabetes eat a low sugar diet we will update labs today  Chronic knee pain, arthritis You can use topical treatments such as Aspercreme or capsaicin cream or Voltaren gel over-the-counter to help with pain Due to abnormal kidney function, and in order to protect your kidneys, I recommend you avoid medications that can harm the kidneys such as ibuprofen, Aleve, Advil, Motrin, Naprosyn, or prescription anti-inflammatories which are used for pain, inflammation, and arthritis.   You should avoid dehydration which can harm the kidneys.  Please go to [facility] for your knees xray.   Their hours are 8am - 4:30 pm Monday - Friday.  Take your insurance card with you.  [facility] [phone]  [address] [place], [place] [address]  Spent > 30 minutes face to face with patient in discussion of symptoms, evaluation, plan and recommendations.

## 2024-03-01 NOTE — Progress Notes (Signed)
 Subjective:  Peter Russell is a 78 y.o. male who presents for Chief Complaint  Patient presents with   Consult    Patient is here and wants fasting labs,lipids/kidney function.  Also having B/L knee pain x 6 months, R worse than L. CPAP is 41-78 years old and he thinks he may need a new machine soon at some point.     Here for several concerns.  Has questions about CPAP.  Currently CPAP is working fine, using it most every night.  Last sleep study 13 years ago.  Worried that at some point if his machine breaks down, he needs to know what is the process.  He wants labs kidney to recheck kidney, cholesterol, liver.  Hypertension-compliant with medications.  Blood pressures at home are normal  He continues on Crestor  20 mg daily.  No side effects reported  He does note arthritis in his knees.  Hurts to deep bend and hurts going up and down stairs.  He wonders what he can take for this or due to remedy some of his symptoms.  No swelling in the knees.  Right knee worse than left.  No other aggravating or relieving factors.  No other c/o.  Past Medical History:  Diagnosis Date   Arthritis    sternum and knees   CKD (chronic kidney disease) stage 3, GFR 30-59 ml/min (HCC) 2020   Hypertension    Prostate cancer (HCC)    Current Outpatient Medications on File Prior to Visit  Medication Sig Dispense Refill   amLODipine  (NORVASC ) 5 MG tablet Take 1 tablet (5 mg total) by mouth daily. 90 tablet 2   calcium -vitamin D (OSCAL WITH D) 500-5 MG-MCG tablet Take 1 tablet by mouth.     Multiple Vitamins-Minerals (EMERGEN-C IMMUNE PO) Take 1 packet by mouth daily.     rosuvastatin  (CRESTOR ) 20 MG tablet Take 1 tablet (20 mg total) by mouth daily. 90 tablet 1   valsartan -hydrochlorothiazide  (DIOVAN -HCT) 160-25 MG tablet Take 1 tablet by mouth once daily 90 tablet 2   XTANDI 40 MG capsule Take 80 mg by mouth daily.     No current facility-administered medications on file prior to visit.     The  following portions of the patient's history were reviewed and updated as appropriate: allergies, current medications, past family history, past medical history, past social history, past surgical history and problem list.  ROS Otherwise as in subjective above    Objective: BP 128/78   Pulse 78   Ht 5' 9 (1.753 m)   Wt 209 lb 9.6 oz (95.1 kg)   SpO2 97%   BMI 30.95 kg/m   Wt Readings from Last 3 Encounters:  03/01/24 209 lb 9.6 oz (95.1 kg)  08/26/23 184 lb (83.5 kg)  08/25/23 188 lb 12.8 oz (85.6 kg)   BP Readings from Last 3 Encounters:  03/01/24 128/78  09/28/23 118/62  08/26/23 (!) 153/84    General appearance: alert, no distress, well developed, well nourished Heart: RRR, normal S1, S2, no murmurs Lungs: CTA bilaterally, no wheezes, rhonchi, or rales MSK: Bony arthritic changes noted to the patella and tibial plateau bilaterally but no obvious laxity, no swelling, relatively normal range of motion of knees bilaterally, rest of legs unremarkable Pulses: 2+ radial pulses, 2+ pedal pulses, normal cap refill Ext: no edema     Assessment: Encounter Diagnoses  Name Primary?   OSA (obstructive sleep apnea) Yes   Stage 3b chronic kidney disease (HCC)    Essential hypertension, benign  Chronic gout without tophus, unspecified cause, unspecified site    Prediabetes    Chronic pain of both knees      Plan: OSA - refer to updated sleep study to be ready in case his CPAP stops working . Currenlty using CPAP nightly.  Hyperlipidemia - continue with rosuvastatin  Crestor  20mg  daily.  Last lipids 09/2023 with LDL 63, HDL 38 though.  Hypertension-continue current medications amlodipine  5 mg daily and valsartan  HCT 160/25 mg daily.  CKD 3b Chronic kidney disease stage IIIb Hydrate with 80 to 100 ounces of water daily.  Avoid dehydration  History of gout, elevated uric acid-updated labs today  Prediabetes eat a low sugar diet we will update labs today  Chronic knee  pain, arthritis You can use topical treatments such as Aspercreme or capsaicin cream or Voltaren gel over-the-counter to help with pain Due to abnormal kidney function, and in order to protect your kidneys, I recommend you avoid medications that can harm the kidneys such as ibuprofen, Aleve, Advil, Motrin, Naprosyn, or prescription anti-inflammatories which are used for pain, inflammation, and arthritis.   You should avoid dehydration which can harm the kidneys.  Please go to [facility] for your knees xray.   Their hours are 8am - 4:30 pm Monday - Friday.  Take your insurance card with you.  [facility] [phone]  [address] [place], [place] [address]  Spent > 30 minutes face to face with patient in discussion of symptoms, evaluation, plan and recommendations.    Ifeanyi was seen today for consult.  Diagnoses and all orders for this visit:  OSA (obstructive sleep apnea)  Stage 3b chronic kidney disease (HCC) -     Uric acid -     Renal Function Panel -     Phosphorus  Essential hypertension, benign -     Renal Function Panel  Chronic gout without tophus, unspecified cause, unspecified site -     Uric acid  Prediabetes -     Hemoglobin A1c  Chronic pain of both knees -     DG Knee Complete 4 Views Left; Future -     DG Knee Complete 4 Views Right; Future    Follow up: pending labs, xray

## 2024-03-02 ENCOUNTER — Other Ambulatory Visit: Payer: Self-pay | Admitting: Medical

## 2024-03-02 ENCOUNTER — Ambulatory Visit: Payer: Self-pay | Admitting: Medical

## 2024-03-02 LAB — RENAL FUNCTION PANEL
Albumin: 4.1 g/dL (ref 3.8–4.8)
BUN/Creatinine Ratio: 17 (ref 10–24)
BUN: 23 mg/dL (ref 8–27)
CO2: 20 mmol/L (ref 20–29)
Calcium: 9.2 mg/dL (ref 8.6–10.2)
Chloride: 99 mmol/L (ref 96–106)
Creatinine, Ser: 1.35 mg/dL — ABNORMAL HIGH (ref 0.76–1.27)
Glucose: 114 mg/dL — ABNORMAL HIGH (ref 70–99)
Phosphorus: 3 mg/dL (ref 2.8–4.1)
Potassium: 4.3 mmol/L (ref 3.5–5.2)
Sodium: 136 mmol/L (ref 134–144)
eGFR: 54 mL/min/1.73 — ABNORMAL LOW (ref >59)

## 2024-03-02 LAB — HEMOGLOBIN A1C
Est. average glucose Bld gHb Est-mCnc: 137 mg/dL
Hgb A1c MFr Bld: 6.4 % — ABNORMAL HIGH (ref 4.8–5.6)

## 2024-03-02 LAB — URIC ACID: Uric Acid: 8.4 mg/dL (ref 3.8–8.4)

## 2024-03-02 MED ORDER — VALSARTAN-HYDROCHLOROTHIAZIDE 160-12.5 MG PO TABS: 1.0000 | ORAL_TABLET | Freq: Every day | ORAL | 2 refills | Status: AC

## 2024-03-02 MED ORDER — ALLOPURINOL 100 MG PO TABS: 100.0000 mg | ORAL_TABLET | Freq: Every day | ORAL | 0 refills | Status: DC

## 2024-03-02 NOTE — Progress Notes (Signed)
 Diabetes marker okay at 6.4%.  Uric acid is higher than I would like to 8.4.  Kidney marker similar to prior, chronic kidney disease.  I recommend we change valsartan  HCT 160 /25-160/12.5 to reduce the fluid pill component dose.  Also recommend beginning allopurinol  once daily to lower your uric acid which could be causing some impact on the kidney  Increase the water intake.  Go for x-ray of the knees

## 2024-03-07 ENCOUNTER — Other Ambulatory Visit: Payer: Self-pay | Admitting: Internal Medicine

## 2024-03-07 DIAGNOSIS — R936 Abnormal findings on diagnostic imaging of limbs: Secondary | ICD-10-CM

## 2024-03-07 NOTE — Progress Notes (Signed)
 Your x-ray shows severe degenerative age-related changes of both knees, there is a potential cyst in the left knee that may be rubbing on the pattellar surface, and some fluid in right knee .   If agreeable, lets refer to orthopedics to discuss findings, possible treatment options going forward in regards to pain, function, and swelling.

## 2024-03-08 ENCOUNTER — Ambulatory Visit (INDEPENDENT_AMBULATORY_CARE_PROVIDER_SITE_OTHER): Payer: Medicare Other

## 2024-03-08 VITALS — BP 128/78 | HR 71 | Temp 97.6°F | Ht 70.0 in | Wt 212.4 lb

## 2024-03-08 DIAGNOSIS — Z0000 Encounter for general adult medical examination without abnormal findings: Secondary | ICD-10-CM

## 2024-03-08 NOTE — Patient Instructions (Signed)
 Peter Russell , Thank you for taking time out of your busy schedule to complete your Annual Wellness Visit with me. I enjoyed our conversation and look forward to speaking with you again next year. I, as well as your care team,  appreciate your ongoing commitment to your health goals. Please review the following plan we discussed and let me know if I can assist you in the future. Your Game plan/ To Do List    Referrals: If you haven't heard from the office you've been referred to, please reach out to them at the phone provided.  N/a Follow up Visits: Next Medicare AWV with our clinical staff: 03/21/2025 at 2:10   Have you seen your provider in the last 6 months (3 months if uncontrolled diabetes)? Yes Next Office Visit with your provider: will call to schedule in about 6 months  Clinician Recommendations:  Aim for 30 minutes of exercise or brisk walking, 6-8 glasses of water, and 5 servings of fruits and vegetables each day.       This is a list of the screening recommended for you and due dates:  Health Maintenance  Topic Date Due   COVID-19 Vaccine (1) Never done   Zoster (Shingles) Vaccine (1 of 2) Never done   DTaP/Tdap/Td vaccine (1 - Tdap) 08/24/2024*   Pneumococcal Vaccine for age over 67 (1 of 2 - PCV) 08/24/2024*   Flu Shot  03/11/2024   Medicare Annual Wellness Visit  03/08/2025   Hepatitis C Screening  Completed   Hepatitis B Vaccine  Aged Out   HPV Vaccine  Aged Out   Meningitis B Vaccine  Aged Out  *Topic was postponed. The date shown is not the original due date.    Advanced directives: (Declined) Advance directive discussed with you today. Even though you declined this today, please call our office should you change your mind, and we can give you the proper paperwork for you to fill out. Advance Care Planning is important because it:  [x]  Makes sure you receive the medical care that is consistent with your values, goals, and preferences  [x]  It provides guidance to your  family and loved ones and reduces their decisional burden about whether or not they are making the right decisions based on your wishes.  Follow the link provided in your after visit summary or read over the paperwork we have mailed to you to help you started getting your Advance Directives in place. If you need assistance in completing these, please reach out to us  so that we can help you!  See attachments for Preventive Care and Fall Prevention Tips.

## 2024-03-08 NOTE — Progress Notes (Signed)
 Subjective:   Holton Sidman is a 78 y.o. who presents for a Medicare Wellness preventive visit.  As a reminder, Annual Wellness Visits don't include a physical exam, and some assessments may be limited, especially if this visit is performed virtually. We may recommend an in-person follow-up visit with your provider if needed.  Visit Complete: In person    Persons Participating in Visit: Patient.  AWV Questionnaire: Yes: Patient Medicare AWV questionnaire was completed by the patient on 03/07/2024; I have confirmed that all information answered by patient is correct and no changes since this date.  Cardiac Risk Factors include: advanced age (>45men, >26 women);hypertension;male gender     Objective:    Today's Vitals   03/08/24 1353  BP: 128/78  Pulse: 71  Temp: 97.6 F (36.4 C)  TempSrc: Oral  SpO2: 96%  Weight: 212 lb 6.4 oz (96.3 kg)  Height: 5' 10 (1.778 m)   Body mass index is 30.48 kg/m.     03/08/2024    2:02 PM 03/03/2023    2:00 PM 02/28/2022    1:44 PM 02/02/2020   10:08 AM  Advanced Directives  Does Patient Have a Medical Advance Directive? No No No No  Would patient like information on creating a medical advance directive? No - Patient declined  No - Patient declined     Current Medications (verified) Outpatient Encounter Medications as of 03/08/2024  Medication Sig   amLODipine  (NORVASC ) 5 MG tablet Take 1 tablet (5 mg total) by mouth daily.   calcium -vitamin D (OSCAL WITH D) 500-5 MG-MCG tablet Take 1 tablet by mouth.   Multiple Vitamins-Minerals (EMERGEN-C IMMUNE PO) Take 1 packet by mouth daily.   rosuvastatin  (CRESTOR ) 20 MG tablet Take 1 tablet (20 mg total) by mouth daily.   valsartan -hydrochlorothiazide  (DIOVAN -HCT) 160-12.5 MG tablet Take 1 tablet by mouth daily.   XTANDI 40 MG capsule Take 80 mg by mouth daily.   allopurinol  (ZYLOPRIM ) 100 MG tablet Take 1 tablet (100 mg total) by mouth daily. (Patient not taking: Reported on 03/08/2024)   No  facility-administered encounter medications on file as of 03/08/2024.    Allergies (verified) Patient has no known allergies.   History: Past Medical History:  Diagnosis Date   Arthritis    sternum and knees   CKD (chronic kidney disease) stage 3, GFR 30-59 ml/min (HCC) 2020   Hypertension    Prostate cancer [facility])    Past Surgical History:  Procedure Laterality Date   APPENDECTOMY     cataract surgery Bilateral    06/2021, 08/2021   ESOPHAGEAL MANOMETRY N/A 04/08/2023   Procedure: ESOPHAGEAL MANOMETRY (EM);  Surgeon: Rollin Dover, MD;  Location: [facility];  Service: Gastroenterology;  Laterality: N/A;   NASAL SINUS SURGERY     PROSTATE CRYOABLATION     Family History  Problem Relation Age of Onset   Alcohol abuse Father    Diabetes Father    Cancer Brother        prostate   Social History   Socioeconomic History   Marital status: Widowed    Spouse name: Not on file   Number of children: Not on file   Years of education: Not on file   Highest education level: Professional school degree (e.g., MD, DDS, DVM, JD)  Occupational History   Not on file  Tobacco Use   Smoking status: Former   Smokeless tobacco: Never  Vaping Use   Vaping status: Never Used  Substance and Sexual Activity   Alcohol use: Never  Drug use: Never   Sexual activity: Not on file  Other Topics Concern   Not on file  Social History Narrative   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  Has children.  Was living in [place]  prior, moved to [place] fall 2020.   As of  02/2021   Social Drivers of Health   Financial Resource Strain: Low Risk  (03/07/2024)   Overall Financial Resource Strain (CARDIA)    Difficulty of Paying Living Expenses: Not hard at all  Food Insecurity: No Food Insecurity (03/07/2024)   Hunger Vital Sign    Worried About Running Out of Food in the Last Year: Never true    Ran Out of Food in the Last  Year: Never true  Transportation Needs: No Transportation Needs (03/07/2024)   PRAPARE - Administrator, Civil Service (Medical): No    Lack of Transportation (Non-Medical): No  Physical Activity: Insufficiently Active (03/07/2024)   Exercise Vital Sign    Days of Exercise per Week: 1 day    Minutes of Exercise per Session: 30 min  Stress: No Stress Concern Present (03/07/2024)   [employer] of Occupational Health - Occupational Stress Questionnaire    Feeling of Stress: Not at all  Social Connections: Unknown (03/07/2024)   Social Connection and Isolation Panel    Frequency of Communication with Friends and Family: Patient declined    Frequency of Social Gatherings with Friends and Family: Twice a week    Attends Religious Services: More than 4 times per year    Active Member of [employer] or Organizations: Yes    Attends Banker Meetings: More than 4 times per year    Marital Status: Widowed    Tobacco Counseling Counseling given: Not Answered    Clinical Intake:  Pre-visit preparation completed: Yes  Pain : No/denies pain     Nutritional Status: BMI > 30  Obese Nutritional Risks: None Diabetes: No  Lab Results  Component Value Date   HGBA1C 6.4 (H) 03/01/2024   HGBA1C 5.8 (H) 08/25/2023   HGBA1C 6.0 (H) 06/17/2022     How often do you need to have someone help you when you read instructions, pamphlets, or other written materials from your doctor or pharmacy?: 1 - Never  Interpreter Needed?: No  Information entered by :: NAllen LPN   Activities of Daily Living     03/07/2024    6:23 PM  In your present state of health, do you have any difficulty performing the following activities:  Hearing? 0  Vision? 0  Difficulty concentrating or making decisions? 0  Walking or climbing stairs? 1  Comment due to knees  Dressing or bathing? 0  Doing errands, shopping? 0  Preparing Food and eating ? N  Using the Toilet? N  In the past six  months, have you accidently leaked urine? N  Do you have problems with loss of bowel control? N  Managing your Medications? N  Managing your Finances? N  Housekeeping or managing your Housekeeping? N    Patient Care Team: Tysinger, Alm RAMAN, PA-C as PCP - General (Family Medicine) Alvaro Ricardo KATHEE Raddle., MD as Consulting Physician (Urology) Karis Daniel Motts, MD as Referring Physician (Otolaryngology) Francois, Darin Lyn, MD as Referring Physician (Gastroenterology) Jeffrie Oneil BROCKS, MD as Consulting Physician (Cardiology)  I have updated your Care Teams any recent Medical Services you may have received from other providers in the past year.  Assessment:   This is a routine wellness examination for [place].  Hearing/Vision screen Hearing Screening - Comments:: Denies hearing issues Vision Screening - Comments:: Regular eye exams, [facility]   Goals Addressed             This Visit's Progress    Patient Stated       03/08/2024, start walking again       Depression Screen     03/08/2024    2:04 PM 08/25/2023    2:54 PM 03/03/2023    2:01 PM 06/17/2022    1:02 PM 02/28/2022    1:47 PM 02/15/2021    8:34 AM 02/02/2020   10:08 AM  PHQ 2/9 Scores  PHQ - 2 Score 0 0 0 0 0 0 0  PHQ- 9 Score 2  3  3       Fall Risk     03/07/2024    6:23 PM 08/25/2023    2:54 PM 02/27/2023    5:50 PM 06/17/2022    1:02 PM 02/28/2022    1:46 PM  Fall Risk   Falls in the past year? 0 0 0 0 1  Comment     stepped off of the porch  Number falls in past yr: 0 0 0 0 0  Injury with Fall? 0 0 0 0 0  Risk for fall due to : Medication side effect No Fall Risks Medication side effect No Fall Risks Medication side effect  Follow up Falls prevention discussed;Falls evaluation completed Falls evaluation completed Falls prevention discussed;Falls evaluation completed Falls evaluation completed  Falls evaluation completed;Education provided;Falls prevention discussed      Data saved with a previous flowsheet  row definition    MEDICARE RISK AT HOME:  Medicare Risk at Home Any stairs in or around the home?: (Patient-Rptd) Yes If so, are there any without handrails?: (Patient-Rptd) No Home free of loose throw rugs in walkways, pet beds, electrical cords, etc?: (Patient-Rptd) Yes Adequate lighting in your home to reduce risk of falls?: (Patient-Rptd) Yes Life alert?: (Patient-Rptd) No Use of a cane, walker or w/c?: (Patient-Rptd) No Grab bars in the bathroom?: (Patient-Rptd) No Shower chair or bench in shower?: (Patient-Rptd) No Elevated toilet seat or a handicapped toilet?: (Patient-Rptd) No  TIMED UP AND GO:  Was the test performed?  No  Cognitive Function: 6CIT completed        03/08/2024    2:07 PM 03/03/2023    2:03 PM 02/28/2022    1:51 PM  6CIT Screen  What Year? 0 points 0 points 0 points  What month? 0 points 0 points 0 points  What time? 0 points 0 points 0 points  Count back from 20 0 points 0 points 0 points  Months in reverse 0 points 0 points 0 points  Repeat phrase 0 points 0 points 0 points  Total Score 0 points 0 points 0 points    Immunizations  There is no immunization history on file for this patient.  Screening Tests Health Maintenance  Topic Date Due   COVID-19 Vaccine (1) Never done   Zoster Vaccines- Shingrix (1 of 2) Never done   DTaP/Tdap/Td (1 - Tdap) 08/24/2024 (Originally 09/16/1964)   Pneumococcal Vaccine: 50+ Years (1 of 2 - PCV) 08/24/2024 (Originally 09/16/1964)   INFLUENZA VACCINE  03/11/2024   Medicare Annual Wellness (AWV)  03/08/2025   Hepatitis C Screening  Completed   Hepatitis B Vaccines  Aged Out   HPV VACCINES  Aged Out   Meningococcal  B Vaccine  Aged Out    Health Maintenance  Health Maintenance Due  Topic Date Due   COVID-19 Vaccine (1) Never done   Zoster Vaccines- Shingrix (1 of 2) Never done   Health Maintenance Items Addressed: Declines vaccines.  Additional Screening:  Vision Screening: Recommended annual  ophthalmology exams for early detection of glaucoma and other disorders of the eye. Would you like a referral to an eye doctor? No    Dental Screening: Recommended annual dental exams for proper oral hygiene  Community Resource Referral / Chronic Care Management: CRR required this visit?  No   CCM required this visit?  No   Plan:    I have personally reviewed and noted the following in the patient's chart:   Medical and social history Use of alcohol, tobacco or illicit drugs  Current medications and supplements including opioid prescriptions. Patient is not currently taking opioid prescriptions. Functional ability and status Nutritional status Physical activity Advanced directives List of other physicians Hospitalizations, surgeries, and ER visits in previous 12 months Vitals Screenings to include cognitive, depression, and falls Referrals and appointments  In addition, I have reviewed and discussed with patient certain preventive protocols, quality metrics, and best practice recommendations. A written personalized care plan for preventive services as well as general preventive health recommendations were provided to patient.   Ardella FORBES Dawn, LPN   [id number]/7974   After Visit Summary: (In Person-Declined) Patient declined AVS at this time.  Notes: Nothing significant to report at this time.

## 2024-03-11 ENCOUNTER — Other Ambulatory Visit: Payer: Self-pay | Admitting: Medical

## 2024-03-11 DIAGNOSIS — N1832 Chronic kidney disease, stage 3b: Secondary | ICD-10-CM

## 2024-03-22 ENCOUNTER — Encounter: Payer: Self-pay | Admitting: Orthopaedic Surgery

## 2024-03-22 ENCOUNTER — Ambulatory Visit (INDEPENDENT_AMBULATORY_CARE_PROVIDER_SITE_OTHER): Admitting: Orthopaedic Surgery

## 2024-03-22 DIAGNOSIS — M1711 Unilateral primary osteoarthritis, right knee: Secondary | ICD-10-CM | POA: Insufficient documentation

## 2024-03-22 DIAGNOSIS — M1712 Unilateral primary osteoarthritis, left knee: Secondary | ICD-10-CM | POA: Insufficient documentation

## 2024-03-22 MED ORDER — MELOXICAM 7.5 MG PO TABS: 7.5000 mg | ORAL_TABLET | Freq: Two times a day (BID) | ORAL | 2 refills | Status: DC | PRN

## 2024-03-22 NOTE — Progress Notes (Signed)
 Office Visit Note   Patient: Peter Russell           Date of Birth: 1945/12/18           MRN: [id number] Visit Date: 03/22/2024              Requested by: Bulah Alm RAMAN, PA-C [address] [place],  [place] [address] PCP: Bulah Alm RAMAN, PA-C   Assessment & Plan: Visit Diagnoses:  1. Primary osteoarthritis of right knee   2. Primary osteoarthritis of left knee     Plan: History of Present Illness Peter Russell is a 78 year old male with a history of prostate cancer who presents with knee pain.  He has experienced knee pain for about a year, with the right knee more affected than the left. The pain worsens with kneeling, climbing stairs, and descending inclines but does not disturb sleep. Over-the-counter treatments, including capsaicin cream and Tylenol, have been ineffective. 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  He has a history of prostate cancer with bone metastasis, currently in full remission for four years with negligible PSA levels. He is on androgen deprivation therapy and is concerned about differentiating between arthritis and cancer-related bone pain.  He attempts to walk a couple of times a week, either in his neighborhood or at a senior center gym. Walking on flat ground does not cause pain, but climbing stairs significantly exacerbates it. He acknowledges a weight issue. He does not use knee braces and has not undergone recent physical therapy.  Physical Exam MUSCULOSKELETAL: No tenderness along the sides of the knees.  Normal painless range of motion.  Collaterals and cruciates are stable.  Results RADIOLOGY Knee X-ray: X-rays of bilateral knees show tricompartment degenerative changes worse in the right knee with bone-on-bone joint space narrowing of the medial compartment and periarticular spurring.  Assessment and Plan Bilateral knee osteoarthritis Chronic  bilateral knee osteoarthritis, right knee more symptomatic with bone-on-bone arthritis. Current management ineffective. - Prescribe meloxicam . - Refer to physical therapy for strengthening. - Consider cortisone injection if symptoms persist.  Follow-Up Instructions: No follow-ups on file.   Orders:  Orders Placed This Encounter  Procedures   Ambulatory referral to Physical Therapy   Meds ordered this encounter  Medications   meloxicam  (MOBIC ) 7.5 MG tablet    Sig: Take 1 tablet (7.5 mg total) by mouth 2 (two) times daily as needed for pain.    Dispense:  30 tablet    Refill:  2      Procedures: No procedures performed   Clinical Data: No additional findings.   Subjective: Chief Complaint  Patient presents with   Left Knee - Pain   Right Knee - Pain    HPI  Review of Systems  Constitutional: Negative.   HENT: Negative.    Eyes: Negative.   Respiratory: Negative.    Cardiovascular: Negative.   Gastrointestinal: Negative.   Endocrine: Negative.   Genitourinary: Negative.   Skin: Negative.   Allergic/Immunologic: Negative.   Neurological: Negative.   Hematological: Negative.   Psychiatric/Behavioral: Negative.    All other systems reviewed and are negative.    Objective: Vital Signs: There were no vitals taken for this visit.  Physical Exam Vitals and nursing note reviewed.  Constitutional:      Appearance: He is well-developed.  HENT:     Head: Normocephalic and atraumatic.  Eyes:     Pupils: Pupils are equal, round,  and reactive to light.  Pulmonary:     Effort: Pulmonary effort is normal.  Abdominal:     Palpations: Abdomen is soft.  Musculoskeletal:        General: Normal range of motion.     Cervical back: Neck supple.  Skin:    General: Skin is warm.  Neurological:     Mental Status: He is alert and oriented to person, place, and time.  Psychiatric:        Behavior: Behavior normal.        Thought Content: Thought content normal.         Judgment: Judgment normal.     Ortho Exam  Specialty Comments:  No specialty comments available.  Imaging: No results found.   PMFS History: Patient Active Problem List   Diagnosis Date Noted   Primary osteoarthritis of right knee 03/22/2024   Primary osteoarthritis of left knee 03/22/2024   Sensorineural hearing loss, bilateral 08/27/2023   Impacted cerumen of both ears 08/27/2023   Aortic atherosclerosis (HCC) 08/25/2023   Achalasia 08/25/2023   History of prostate cancer 08/25/2023   Shortness of breath 05/24/2021   PVCs (premature ventricular contractions) 05/24/2021   Atypical chest pain 05/24/2021   Personal history of rheumatoid arthritis 02/15/2021   Malignant neoplasm metastatic to bone (HCC) 02/15/2021   Screen for colon cancer 02/15/2021   Vision impairment 02/15/2021   Dysphagia 02/15/2021   Abnormal abdominal CT scan 02/15/2021   Screening for heart disease 02/02/2020   Vaccine counseling 02/02/2020   Chronic gout without tophus 02/02/2020   Advance directive discussed with patient 02/02/2020   Prostate cancer (HCC) 09/28/2019   Essential hypertension, benign 09/28/2019   Urine frequency 09/28/2019   OSA (obstructive sleep apnea) 09/28/2019   Vaccine refused by patient 09/28/2019   Medicare annual wellness visit, subsequent 09/28/2019   Past Medical History:  Diagnosis Date   Arthritis    sternum and knees   CKD (chronic kidney disease) stage 3, GFR 30-59 ml/min (HCC) 2020   Hypertension    Prostate cancer (HCC)     Family History  Problem Relation Age of Onset   Alcohol abuse Father    Diabetes Father    Cancer Brother        prostate    Past Surgical History:  Procedure Laterality Date   APPENDECTOMY     cataract surgery Bilateral    06/2021, 08/2021   ESOPHAGEAL MANOMETRY N/A 04/08/2023   Procedure: ESOPHAGEAL MANOMETRY (EM);  Surgeon: Rollin Dover, MD;  Location: [facility];  Service: Gastroenterology;  Laterality: N/A;   NASAL  SINUS SURGERY     PROSTATE CRYOABLATION     Social History   Occupational History   Not on file  Tobacco Use   Smoking status: Former   Smokeless tobacco: Never  Vaping Use   Vaping status: Never Used  Substance and Sexual Activity   Alcohol use: Never   Drug use: Never   Sexual activity: Not on file

## 2024-03-23 ENCOUNTER — Other Ambulatory Visit: Payer: Self-pay

## 2024-03-23 MED ORDER — MELOXICAM 15 MG PO TABS: 15.0000 mg | ORAL_TABLET | Freq: Every day | ORAL | 0 refills | Status: DC | PRN

## 2024-03-23 NOTE — Telephone Encounter (Signed)
 Please send 15 mg meloxicam  daily prn.  #30.  Thanks.

## 2024-03-28 ENCOUNTER — Ambulatory Visit: Payer: Self-pay | Admitting: Medical

## 2024-03-28 NOTE — Progress Notes (Signed)
 Sleep study results do still show sleep apnea and they give a pressure setting of 8 cm of water  If you would like to go ahead and move forward on getting a new CPAP set up now we can refer.  However he did say that his machine is still working so he may just want to continue that for the time being.  We did the sleep study to be ready in case his machine breaks down or quits working

## 2024-04-01 DIAGNOSIS — G4733 Obstructive sleep apnea (adult) (pediatric): Secondary | ICD-10-CM

## 2024-04-07 ENCOUNTER — Encounter: Payer: Self-pay | Admitting: Rehabilitative and Restorative Service Providers"

## 2024-04-07 ENCOUNTER — Ambulatory Visit (INDEPENDENT_AMBULATORY_CARE_PROVIDER_SITE_OTHER): Admitting: Rehabilitative and Restorative Service Providers"

## 2024-04-07 DIAGNOSIS — M6281 Muscle weakness (generalized): Secondary | ICD-10-CM

## 2024-04-07 DIAGNOSIS — M25662 Stiffness of left knee, not elsewhere classified: Secondary | ICD-10-CM

## 2024-04-07 DIAGNOSIS — R262 Difficulty in walking, not elsewhere classified: Secondary | ICD-10-CM | POA: Diagnosis not present

## 2024-04-07 DIAGNOSIS — M25561 Pain in right knee: Secondary | ICD-10-CM

## 2024-04-07 DIAGNOSIS — R600 Localized edema: Secondary | ICD-10-CM

## 2024-04-07 DIAGNOSIS — M25661 Stiffness of right knee, not elsewhere classified: Secondary | ICD-10-CM

## 2024-04-07 DIAGNOSIS — M25562 Pain in left knee: Secondary | ICD-10-CM

## 2024-04-07 DIAGNOSIS — G8929 Other chronic pain: Secondary | ICD-10-CM

## 2024-04-07 NOTE — Therapy (Signed)
 OUTPATIENT PHYSICAL THERAPY LOWER EXTREMITY EVALUATION   Patient Name: Peter Russell MRN: [id number] DOB:07-26-1946, 78 y.o., male Today's Date: 04/07/2024  END OF SESSION:  PT End of Session - 04/07/24 1632     Visit Number 1    Number of Visits 16    Date for PT Re-Evaluation 06/02/24    Authorization Type MEDICARE/BCBS    Progress Note Due on Visit 10    PT Start Time 1301    PT Stop Time 1347    PT Time Calculation (min) 46 min    Activity Tolerance Patient tolerated treatment well;No increased pain    Behavior During Therapy WFL for tasks assessed/performed          Past Medical History:  Diagnosis Date   Arthritis    sternum and knees   CKD (chronic kidney disease) stage 3, GFR 30-59 ml/min (HCC) 2020   Hypertension    Prostate cancer [facility])    Past Surgical History:  Procedure Laterality Date   APPENDECTOMY     cataract surgery Bilateral    06/2021, 08/2021   ESOPHAGEAL MANOMETRY N/A 04/08/2023   Procedure: ESOPHAGEAL MANOMETRY (EM);  Surgeon: Rollin Dover, MD;  Location: [facility];  Service: Gastroenterology;  Laterality: N/A;   NASAL SINUS SURGERY     PROSTATE CRYOABLATION     Patient Active Problem List   Diagnosis Date Noted   Primary osteoarthritis of right knee 03/22/2024   Primary osteoarthritis of left knee 03/22/2024   Sensorineural hearing loss, bilateral 08/27/2023   Impacted cerumen of both ears 08/27/2023   Aortic atherosclerosis (HCC) 08/25/2023   Achalasia 08/25/2023   History of prostate cancer 08/25/2023   Shortness of breath 05/24/2021   PVCs (premature ventricular contractions) 05/24/2021   Atypical chest pain 05/24/2021   Personal history of rheumatoid arthritis 02/15/2021   Malignant neoplasm metastatic to bone (HCC) 02/15/2021   Screen for colon cancer 02/15/2021   Vision impairment 02/15/2021   Dysphagia 02/15/2021   Abnormal abdominal CT scan 02/15/2021   Screening for heart disease 02/02/2020   Vaccine counseling 02/02/2020    Chronic gout without tophus 02/02/2020   Advance directive discussed with patient 02/02/2020   Prostate cancer (HCC) 09/28/2019   Essential hypertension, benign 09/28/2019   Urine frequency 09/28/2019   OSA (obstructive sleep apnea) 09/28/2019   Vaccine refused by patient 09/28/2019   Medicare annual wellness visit, subsequent 09/28/2019    PCP: Alm RAMAN. Tysinger, PA-C  REFERRING PROVIDER: Kay CHRISTELLA Cummins, MD  REFERRING DIAG: M17.11 (ICD-10-CM) - Primary osteoarthritis of right knee M17.12 (ICD-10-CM) - Primary osteoarthritis of left knee  THERAPY DIAG:  Difficulty in walking, not elsewhere classified - Plan: PT plan of care cert/re-cert  Muscle weakness (generalized) - Plan: PT plan of care cert/re-cert  Localized edema - Plan: PT plan of care cert/re-cert  Stiffness of left knee, not elsewhere classified - Plan: PT plan of care cert/re-cert  Stiffness of right knee, not elsewhere classified - Plan: PT plan of care cert/re-cert  Chronic pain of both knees - Plan: PT plan of care cert/re-cert  Rationale for Evaluation and Treatment: Rehabilitation  ONSET DATE: A year ago  SUBJECTIVE:   SUBJECTIVE STATEMENT: [name] notes right > left knee pain of at least a year duration.  He is trying Meloxicam .  He notes trouble descending stairs and hills, difficulty squatting.    PERTINENT HISTORY: Bil knee OA, stage 3 CKD, HTN  PAIN:  Are you having pain? Yes: NPRS scale: 2-5/10 over the past week Pain  location: Bilateral medial knee joints Pain description: Ache, stiff and sore Aggravating factors: Prolonged postures and too much weightbearing Relieving factors: Change of position and gentle movement  PRECAUTIONS: None  RED FLAGS: None   WEIGHT BEARING RESTRICTIONS: No  FALLS:  Has patient fallen in last 6 months? 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  DIAGNOSTIC FINDINGS: IMPRESSION: 1. Severe degenerative changes of the RIGHT knee medial compartment. 2. Moderate degenerative changes of the LEFT knee medial compartment. 3. Subcortical cyst formation with possible patellar surface irregularity along the medial facet of the LEFT patella likely reflecting underlying cartilage deficiency/injury. 4. Small RIGHT knee joint effusion.  PATIENT SURVEYS:  PSFS: THE PATIENT SPECIFIC FUNCTIONAL SCALE  Place score of 0-10 (0 = unable to perform activity and 10 = able to perform activity at the same level as before injury or problem)  Activity Date: 04/07/2024    Ascending stairs 6    2.  Kneeling 2    3.  Descending stairs or a slope like his driveway 4    4.      Total Score 4      Total Score = Sum of activity scores/number of activities  Minimally Detectable Change: 3 points (for single activity); 2 points (for average score)  [name] (nd). The Patient Specific Functional Scale . Retrieved from [email or web address].pt   COGNITION: Overall cognitive status: Within functional limits for tasks assessed     SENSATION: Infrequent foot and ankle tingling  EDEMA:  Noted and not objectively assessed   LOWER EXTREMITY ROM:  Active ROM Left/Right 04/07/2024   Hip flexion    Hip extension    Hip abduction    Hip adduction    Hip internal rotation    Hip external rotation    Knee flexion 132/126   Knee extension 3/2   Ankle dorsiflexion    Ankle plantarflexion    Ankle inversion    Ankle eversion     (Blank rows = not tested)  LOWER EXTREMITY STRENGTH:  Assessed in pounds with  a hand-held dynamometer Left/Right 04/07/2024   Hip flexion    Hip extension    Hip abduction    Hip adduction    Hip internal rotation    Hip external rotation    Knee flexion    Knee extension 62.3/38.2   Ankle dorsiflexion    Ankle plantarflexion    Ankle inversion    Ankle eversion     (Blank rows = not tested)  GAIT: Distance walked: 100 feet Assistive device utilized: None Level of assistance: Complete Independence Comments: Hovanes notes he has difficulty with too much weightbearing or with longer periods of sitting  TREATMENT DATE:  04/07/2024 Quadriceps sets 2 sets of 10 for 5 seconds Seated straight leg raises 2 sets of 5 for 3 seconds  Functional Activities: Sit to stand 5 times, hands as needed  02464: Reviewed knee anatomy; imaging; exam findings and his day 1 home exercise program; discussed injections may be an option in addition to or and lieu of physical therapy and that his worst case scenario is that he will get stronger in preparation for additional medical interventions if needed in the future   PATIENT EDUCATION:  Education details: See above Person educated: Patient Education method: Explanation, Demonstration, Tactile cues, Verbal cues, and Handouts Education comprehension: verbalized understanding, returned demonstration, verbal cues required, tactile cues required, and needs further education  HOME EXERCISE PROGRAM: Access Code: [email or web address] URL: [email or web address] Date: 04/07/2024 Prepared by: Lamar Ivory  Exercises - Supine Quadricep Sets  - 3 x daily - 7 x weekly - 2 sets - 10 reps - 5 second hold - Small Range Straight Leg Raise  - 3 x daily - 7 x weekly - 2 sets - 5 reps - 3 seconds hold - Sit to Stand Without Arm Support  - 2 x daily - 7 x weekly - 1 sets - 5 reps  ASSESSMENT:  CLINICAL  IMPRESSION: Patient is a 78 y.o. male who was seen today for physical therapy evaluation and treatment for M17.11 (ICD-10-CM) - Primary osteoarthritis of right knee M17.12 (ICD-10-CM) - Primary osteoarthritis of left knee.  Koehn has at least a 1 year history of bilateral knee pain, right greater than left.  He presents today with limitations in active range of motion, although quadriceps strength impairments are most significant.  He was set up on a begin her home exercise program with emphasis on quadriceps strengthening to decompress bilateral knee joints to allow for improved comfort with weightbearing along with education for frequent changes of position and to avoid prolonged postures to decrease pain.  OBJECTIVE IMPAIRMENTS: Abnormal gait, decreased activity tolerance, decreased endurance, decreased knowledge of condition, difficulty walking, decreased ROM, decreased strength, increased edema, impaired perceived functional ability, and pain.   ACTIVITY LIMITATIONS: bending, sitting, standing, squatting, stairs, and locomotion level  PARTICIPATION LIMITATIONS: community activity and occupation  PERSONAL FACTORS: Bil knee OA, stage 3 CKD, HTN are also affecting patient's functional outcome.   REHAB POTENTIAL: Good  CLINICAL DECISION MAKING: Evolving/moderate complexity  EVALUATION COMPLEXITY: Moderate   GOALS: Goals reviewed with patient? Yes  SHORT TERM GOALS: Target date: 05/05/2024 Tyus will be independent with his D1 home exercise program Baseline: Started 04/07/2024 Goal status: INITIAL  2.  Improve bilateral quadriceps strength to 75/50 pounds or better Baseline: 62.3/38.2 pounds respectively Goal status: INITIAL   LONG TERM GOALS: Target date: 06/02/2024  Improve patient specific functional scale to at least 6 Baseline: 4 Goal status: INITIAL  2.  Arlin will report bilateral knee pain consistently 2-3/10 on the numeric pain rating scale Baseline: 2-5/10 Goal  status: INITIAL  3.  Improve bilateral quadriceps strength to 80 pounds or greater Baseline: 62.3/38.2 pounds Goal status: INITIAL  4.  Hezzie will be independent with his long-term maintenance home exercise program at discharge Baseline: Started 04/07/2024 Goal status: INITIAL  PLAN:  PT FREQUENCY: 2x/week  PT DURATION: 8 weeks  PLANNED INTERVENTIONS: 97750- Physical Performance Testing, 97110-Therapeutic exercises, 97530- Therapeutic activity, 97112- Neuromuscular re-education, 97535- Self Care, [id number]- Manual therapy, [phone]- Gait training, [phone]- Electrical stimulation (unattended), [id number]- Vasopneumatic device, Patient/Family education, Balance training, Stair training, and Cryotherapy  PLAN FOR NEXT  SESSION: Emphasis on quadriceps strengthening.  Keep home exercise program simple to encourage compliance for long-term success.   Myer LELON Ivory, PT, MPT 04/07/2024, 4:48 PM

## 2024-04-15 NOTE — Therapy (Signed)
 OUTPATIENT PHYSICAL THERAPY LOWER EXTREMITY TREATMENT   Patient Name: Peter Russell MRN: [id number] DOB:01-Aug-1946, 78 y.o., male Today's Date: 04/18/2024  END OF SESSION:  PT End of Session - 04/18/24 1437     Visit Number 2    Number of Visits 16    Date for PT Re-Evaluation 06/02/24    Authorization Type MEDICARE/BCBS    Progress Note Due on Visit 10    PT Start Time 1436    PT Stop Time 1520    PT Time Calculation (min) 44 min    Activity Tolerance Patient tolerated treatment well;No increased pain    Behavior During Therapy WFL for tasks assessed/performed           Past Medical History:  Diagnosis Date   Arthritis    sternum and knees   CKD (chronic kidney disease) stage 3, GFR 30-59 ml/min (HCC) 2020   Hypertension    Prostate cancer [facility])    Past Surgical History:  Procedure Laterality Date   APPENDECTOMY     cataract surgery Bilateral    06/2021, 08/2021   ESOPHAGEAL MANOMETRY N/A 04/08/2023   Procedure: ESOPHAGEAL MANOMETRY (EM);  Surgeon: Rollin Dover, MD;  Location: [facility];  Service: Gastroenterology;  Laterality: N/A;   NASAL SINUS SURGERY     PROSTATE CRYOABLATION     Patient Active Problem List   Diagnosis Date Noted   Primary osteoarthritis of right knee 03/22/2024   Primary osteoarthritis of left knee 03/22/2024   Sensorineural hearing loss, bilateral 08/27/2023   Impacted cerumen of both ears 08/27/2023   Aortic atherosclerosis (HCC) 08/25/2023   Achalasia 08/25/2023   History of prostate cancer 08/25/2023   Shortness of breath 05/24/2021   PVCs (premature ventricular contractions) 05/24/2021   Atypical chest pain 05/24/2021   Personal history of rheumatoid arthritis 02/15/2021   Malignant neoplasm metastatic to bone (HCC) 02/15/2021   Screen for colon cancer 02/15/2021   Vision impairment 02/15/2021   Dysphagia 02/15/2021   Abnormal abdominal CT scan 02/15/2021   Screening for heart disease 02/02/2020   Vaccine counseling 02/02/2020    Chronic gout without tophus 02/02/2020   Advance directive discussed with patient 02/02/2020   Prostate cancer (HCC) 09/28/2019   Essential hypertension, benign 09/28/2019   Urine frequency 09/28/2019   OSA (obstructive sleep apnea) 09/28/2019   Vaccine refused by patient 09/28/2019   Medicare annual wellness visit, subsequent 09/28/2019    PCP: Alm RAMAN. Tysinger, PA-C  REFERRING PROVIDER: Kay CHRISTELLA Cummins, MD  REFERRING DIAG: M17.11 (ICD-10-CM) - Primary osteoarthritis of right knee M17.12 (ICD-10-CM) - Primary osteoarthritis of left knee  THERAPY DIAG:  Difficulty in walking, not elsewhere classified  Muscle weakness (generalized)  Localized edema  Stiffness of left knee, not elsewhere classified  Stiffness of right knee, not elsewhere classified  Rationale for Evaluation and Treatment: Rehabilitation  ONSET DATE: A year ago  SUBJECTIVE:   SUBJECTIVE STATEMENT: Patient noting no pain unless going down stairs or going down a decline.   PERTINENT HISTORY: Bil knee OA, stage 3 CKD, HTN  Hasson notes right > left knee pain of at least a year duration.  He is trying Meloxicam .  He notes trouble descending stairs and hills, difficulty squatting.    PAIN:  Are you having pain? Yes: NPRS scale: 2-5/10 over the past week Pain location: Bilateral medial knee joints Pain description: Ache, stiff and sore Aggravating factors: Prolonged postures and too much weightbearing Relieving factors: Change of position and gentle movement  PRECAUTIONS: None  RED  FLAGS: None   WEIGHT BEARING RESTRICTIONS: No  FALLS:  Has patient fallen in last 6 months? 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  DIAGNOSTIC FINDINGS: IMPRESSION: 1. Severe degenerative changes of the RIGHT knee medial compartment. 2. Moderate degenerative changes of the LEFT knee medial compartment. 3. Subcortical cyst formation with possible patellar surface irregularity along the medial facet of the LEFT patella likely reflecting underlying cartilage deficiency/injury. 4. Small RIGHT knee joint effusion.  PATIENT SURVEYS:  PSFS: THE PATIENT SPECIFIC FUNCTIONAL SCALE  Place score of 0-10 (0 = unable to perform activity and 10 = able to perform activity at the same level as before injury or problem)  Activity Date: 04/07/2024    Ascending stairs 6    2.  Kneeling 2    3.  Descending stairs or a slope like his driveway 4    4.      Total Score 4      Total Score = Sum of activity scores/number of activities  Minimally Detectable Change: 3 points (for single activity); 2 points (for average score)  [name] (nd). The Patient Specific Functional Scale . Retrieved from [email or web address]   COGNITION: Overall cognitive status: Within functional limits for tasks assessed     SENSATION: Infrequent foot and ankle tingling  EDEMA:  Noted and not objectively assessed   LOWER EXTREMITY ROM:  Active ROM Left/Right 04/07/2024   Hip flexion    Hip extension    Hip abduction    Hip adduction    Hip internal rotation    Hip external rotation    Knee flexion 132/126   Knee extension 3/2   Ankle dorsiflexion    Ankle plantarflexion    Ankle inversion    Ankle eversion     (Blank rows = not tested)  LOWER EXTREMITY STRENGTH:  Assessed in pounds with a hand-held dynamometer Left/Right 04/07/2024   Hip flexion    Hip extension    Hip abduction    Hip adduction    Hip internal rotation    Hip external rotation     Knee flexion    Knee extension 62.3/38.2   Ankle dorsiflexion    Ankle plantarflexion    Ankle inversion    Ankle eversion     (Blank rows = not tested)  GAIT: Distance walked: 100 feet Assistive device utilized: None Level of assistance: Complete Independence Comments: Asberry notes he has difficulty with too much weightbearing or with longer periods of sitting  TREATMENT DATE:  04/18/2024 TherEx:  Attempted UBE with bilat LE with patient having complaints of increased pain so discontinued  Nustep level 3 for 6 minutes with bilat LE only  Leg extension machine with bilat LE 2x6 with focus on slow and controlled movement  Seated straight leg raise 2x10 with both LE  PT providing verbal cues for decreasing posterior lean and focus on appropriate knee extension  Tap downs from 2 step 1x10 each leg  Slant board gastroc stretch 2x30s  PT discussed HEP, returning to walking, how exercise/strength training impact bone density, why downhill/eccentric movement is more difficult   04/07/2024 Quadriceps sets 2 sets of 10 for 5 seconds Seated straight leg raises 2 sets of 5 for 3 seconds  Functional Activities: Sit to stand 5 times, hands as needed  97535: Reviewed knee anatomy; imaging; exam findings and his day 1 home exercise program; discussed injections may be an option in addition to or and lieu of physical therapy and that his worst case scenario is that he will get stronger in preparation for additional medical interventions if needed in the future   PATIENT EDUCATION:  Education details: See above Person educated: Patient Education method: Explanation, Demonstration, Tactile cues, Verbal cues, and Handouts Education comprehension: verbalized understanding, returned demonstration, verbal cues required, tactile cues required, and needs further education  HOME  EXERCISE PROGRAM: Access Code: [email or web address] URL: [email or web address] Date: 04/18/2024 Prepared by: Susannah Daring  Exercises - Supine Quadricep Sets  - 3 x daily - 7 x weekly - 2 sets - 10 reps - 5 second hold - Small Range Straight Leg Raise  - 3 x daily - 7 x weekly - 2 sets - 5 reps - 3 seconds hold - Sit to Stand Without Arm Support  - 2 x daily - 7 x weekly - 1 sets - 5 reps - Seated Active Straight-Leg Raise  - 1 x daily - 7 x weekly - 2 sets - 5 reps - 3s hold  ASSESSMENT:  CLINICAL IMPRESSION: Patient arrived to session noting no increase in pain, but had difficulty with performing UBE with bilat LE due to pain/discomfort. Patient tolerated all other activities this date with difficulty due to strength deficits in Rt > Lt. PT updated HEP to include seated leg raise and reprinted the handout for patient. Patient will continue to benefit from skilled PT.  OBJECTIVE IMPAIRMENTS: Abnormal gait, decreased activity tolerance, decreased endurance, decreased knowledge of condition, difficulty walking, decreased ROM, decreased strength, increased edema, impaired perceived functional ability, and pain.   ACTIVITY LIMITATIONS: bending, sitting, standing, squatting, stairs, and locomotion level  PARTICIPATION LIMITATIONS: community activity and occupation  PERSONAL FACTORS: Bil knee OA, stage 3 CKD, HTN are also affecting patient's functional outcome.   REHAB POTENTIAL: Good  CLINICAL DECISION MAKING: Evolving/moderate complexity  EVALUATION COMPLEXITY: Moderate   GOALS: Goals reviewed with patient? Yes  SHORT TERM GOALS: Target date: 05/05/2024 Rathana will be independent with his D1 home exercise program Baseline: Started 04/07/2024 Goal status: INITIAL  2.  Improve bilateral quadriceps strength to 75/50 pounds or better Baseline: 62.3/38.2 pounds respectively Goal status: INITIAL   LONG TERM GOALS: Target date: 06/02/2024  Improve patient specific functional scale  to at least 6 Baseline: 4 Goal status: INITIAL  2.  Ossiel will report bilateral knee pain consistently 2-3/10 on the numeric pain rating scale Baseline: 2-5/10 Goal status: INITIAL  3.  Improve bilateral quadriceps strength to 80 pounds or greater Baseline: 62.3/38.2 pounds Goal status: INITIAL  4.  Story will be independent with his long-term maintenance home exercise program at discharge Baseline: Started 04/07/2024 Goal status: INITIAL  PLAN:  PT FREQUENCY: 2x/week  PT DURATION: 8 weeks  PLANNED INTERVENTIONS: 97750- Physical Performance Testing, 97110-Therapeutic exercises, 97530- Therapeutic activity, 97112- Neuromuscular re-education, 97535- Self Care, 02859- Manual therapy, [id number]- Gait training, [phone]- Electrical stimulation (unattended), 97016- Vasopneumatic device, Patient/Family education, Balance training, Stair training, and Cryotherapy  PLAN FOR NEXT SESSION: Emphasis on quadriceps strengthening.  Keep home exercise program simple to encourage compliance for long-term success.   Susannah Daring, PT, DPT 04/18/24 3:29 PM

## 2024-04-18 ENCOUNTER — Ambulatory Visit

## 2024-04-18 DIAGNOSIS — M25662 Stiffness of left knee, not elsewhere classified: Secondary | ICD-10-CM | POA: Diagnosis not present

## 2024-04-18 DIAGNOSIS — R262 Difficulty in walking, not elsewhere classified: Secondary | ICD-10-CM

## 2024-04-18 DIAGNOSIS — M25661 Stiffness of right knee, not elsewhere classified: Secondary | ICD-10-CM

## 2024-04-18 DIAGNOSIS — M6281 Muscle weakness (generalized): Secondary | ICD-10-CM

## 2024-04-18 DIAGNOSIS — R600 Localized edema: Secondary | ICD-10-CM | POA: Diagnosis not present

## 2024-04-21 ENCOUNTER — Ambulatory Visit (INDEPENDENT_AMBULATORY_CARE_PROVIDER_SITE_OTHER)

## 2024-04-21 DIAGNOSIS — M25661 Stiffness of right knee, not elsewhere classified: Secondary | ICD-10-CM

## 2024-04-21 DIAGNOSIS — M6281 Muscle weakness (generalized): Secondary | ICD-10-CM | POA: Diagnosis not present

## 2024-04-21 DIAGNOSIS — R262 Difficulty in walking, not elsewhere classified: Secondary | ICD-10-CM

## 2024-04-21 DIAGNOSIS — M25662 Stiffness of left knee, not elsewhere classified: Secondary | ICD-10-CM

## 2024-04-21 DIAGNOSIS — R600 Localized edema: Secondary | ICD-10-CM

## 2024-04-21 DIAGNOSIS — M25562 Pain in left knee: Secondary | ICD-10-CM

## 2024-04-21 DIAGNOSIS — G8929 Other chronic pain: Secondary | ICD-10-CM

## 2024-04-21 DIAGNOSIS — M25561 Pain in right knee: Secondary | ICD-10-CM

## 2024-04-21 NOTE — Therapy (Signed)
 OUTPATIENT PHYSICAL THERAPY LOWER EXTREMITY TREATMENT   Patient Name: Peter Russell MRN: [id number] DOB:May 23, 1946, 78 y.o., male Today's Date: 04/21/2024  END OF SESSION:  PT End of Session - 04/21/24 1343     Visit Number 3    Number of Visits 16    Date for PT Re-Evaluation 06/02/24    Authorization Type MEDICARE/BCBS    Progress Note Due on Visit 10    PT Start Time 1300    PT Stop Time 1340    PT Time Calculation (min) 40 min    Activity Tolerance Patient tolerated treatment well    Behavior During Therapy WFL for tasks assessed/performed            Past Medical History:  Diagnosis Date   Arthritis    sternum and knees   CKD (chronic kidney disease) stage 3, GFR 30-59 ml/min (HCC) 2020   Hypertension    Prostate cancer [facility])    Past Surgical History:  Procedure Laterality Date   APPENDECTOMY     cataract surgery Bilateral    06/2021, 08/2021   ESOPHAGEAL MANOMETRY N/A 04/08/2023   Procedure: ESOPHAGEAL MANOMETRY (EM);  Surgeon: Rollin Dover, MD;  Location: [facility];  Service: Gastroenterology;  Laterality: N/A;   NASAL SINUS SURGERY     PROSTATE CRYOABLATION     Patient Active Problem List   Diagnosis Date Noted   Primary osteoarthritis of right knee 03/22/2024   Primary osteoarthritis of left knee 03/22/2024   Sensorineural hearing loss, bilateral 08/27/2023   Impacted cerumen of both ears 08/27/2023   Aortic atherosclerosis (HCC) 08/25/2023   Achalasia 08/25/2023   History of prostate cancer 08/25/2023   Shortness of breath 05/24/2021   PVCs (premature ventricular contractions) 05/24/2021   Atypical chest pain 05/24/2021   Personal history of rheumatoid arthritis 02/15/2021   Malignant neoplasm metastatic to bone (HCC) 02/15/2021   Screen for colon cancer 02/15/2021   Vision impairment 02/15/2021   Dysphagia 02/15/2021   Abnormal abdominal CT scan 02/15/2021   Screening for heart disease 02/02/2020   Vaccine counseling 02/02/2020   Chronic  gout without tophus 02/02/2020   Advance directive discussed with patient 02/02/2020   Prostate cancer (HCC) 09/28/2019   Essential hypertension, benign 09/28/2019   Urine frequency 09/28/2019   OSA (obstructive sleep apnea) 09/28/2019   Vaccine refused by patient 09/28/2019   Medicare annual wellness visit, subsequent 09/28/2019    PCP: Alm RAMAN. Tysinger, PA-C  REFERRING PROVIDER: Kay CHRISTELLA Cummins, MD  REFERRING DIAG: M17.11 (ICD-10-CM) - Primary osteoarthritis of right knee M17.12 (ICD-10-CM) - Primary osteoarthritis of left knee  THERAPY DIAG:  Difficulty in walking, not elsewhere classified  Muscle weakness (generalized)  Localized edema  Stiffness of left knee, not elsewhere classified  Stiffness of right knee, not elsewhere classified  Chronic pain of both knees  Rationale for Evaluation and Treatment: Rehabilitation  ONSET DATE: A year ago  SUBJECTIVE:   SUBJECTIVE STATEMENT: Patient reports some soreness after last visit.  Walking on the driveway which is slanted can be challenging.    PERTINENT HISTORY: Bil knee OA, stage 3 CKD, HTN  Peter Russell notes right > left knee pain of at least a year duration.  He is trying Meloxicam .  He notes trouble descending stairs and hills, difficulty squatting.    PAIN:  Are you having pain? Yes: NPRS scale: 2/10 walking in today Pain location: Bilateral medial knee joints Pain description: Ache, stiff and sore Aggravating factors: Prolonged postures and too much weightbearing Relieving factors: Change  of position and gentle movement  PRECAUTIONS: None  RED FLAGS: None   WEIGHT BEARING RESTRICTIONS: No  FALLS:  Has patient fallen in last 6 months? 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  DIAGNOSTIC FINDINGS: IMPRESSION: 1. Severe degenerative changes of the RIGHT knee medial compartment. 2. Moderate degenerative changes of the LEFT knee medial compartment. 3. Subcortical cyst formation with possible patellar surface irregularity along the medial facet of the LEFT patella likely reflecting underlying cartilage deficiency/injury. 4. Small RIGHT knee joint effusion.  PATIENT SURVEYS:  PSFS: THE PATIENT SPECIFIC FUNCTIONAL SCALE  Place score of 0-10 (0 = unable to perform activity and 10 = able to perform activity at the same level as before injury or problem)  Activity Date: 04/07/2024    Ascending stairs 6    2.  Kneeling 2    3.  Descending stairs or a slope like his driveway 4    4.      Total Score 4      Total Score = Sum of activity scores/number of activities  Minimally Detectable Change: 3 points (for single activity); 2 points (for average score)  [facility] (nd). The Patient Specific Functional Scale . Retrieved from [email or web address]   COGNITION: Overall cognitive status: Within functional limits for tasks assessed     SENSATION: Infrequent foot and ankle tingling  EDEMA:  Noted and not objectively assessed   LOWER EXTREMITY ROM:  Active ROM Left/Right 04/07/2024   Hip flexion    Hip extension    Hip abduction    Hip adduction    Hip internal rotation    Hip external rotation    Knee flexion 132/126   Knee extension 3/2   Ankle dorsiflexion    Ankle plantarflexion    Ankle inversion    Ankle eversion     (Blank rows = not tested)  LOWER EXTREMITY STRENGTH:  Assessed in pounds with a hand-held dynamometer Left/Right 04/07/2024   Hip flexion    Hip extension    Hip abduction    Hip adduction    Hip  internal rotation    Hip external rotation    Knee flexion    Knee extension 62.3/38.2   Ankle dorsiflexion    Ankle plantarflexion    Ankle inversion    Ankle eversion     (Blank rows = not tested)  GAIT: Distance walked: 100 feet Assistive device utilized: None Level of assistance: Complete Independence Comments: Peter Russell notes he has difficulty with too much weightbearing or with longer periods of sitting  TREATMENT DATE:  04/21/24 TherEx:  Nustep level 3 for 6 minutes with bilat LE only  Seated straight leg raise 2x7with both LE 2# Quad sets 15x bilateral Slant board gastroc stretch 2x30s  There Act  Step ups/down/taps from 4 and 6 step 1x10 each leg  Heel to toe balance 15 sec x2 each leg  04/18/2024 TherEx:  Attempted UBE with bilat LE with patient having complaints of increased pain so discontinued  Nustep level 3 for 6 minutes with bilat LE only  Leg extension machine with bilat LE 2x6 with focus on slow and controlled movement  Seated straight leg raise 2x10 with both LE  PT providing verbal cues for decreasing posterior lean and focus on appropriate knee extension  Tap downs from 2 step 1x10 each leg  Slant board gastroc stretch 2x30s  PT discussed HEP, returning to walking, how exercise/strength training impact bone density, why downhill/eccentric movement is more difficult   04/07/2024 Quadriceps sets 2 sets of 10 for 5 seconds Seated straight leg raises 2 sets of 5 for 3 seconds  Functional Activities: Sit to stand 5 times, hands as needed  02464: Reviewed knee anatomy; imaging; exam findings and his day 1 home exercise program; discussed injections may be an option in addition to or and lieu of physical therapy and that his worst case scenario is that he will get stronger in preparation for additional medical interventions if needed in the  future   PATIENT EDUCATION:  Education details: See above Person educated: Patient Education method: Explanation, Demonstration, Tactile cues, Verbal cues, and Handouts Education comprehension: verbalized understanding, returned demonstration, verbal cues required, tactile cues required, and needs further education  HOME EXERCISE PROGRAM: Access Code: [email or web address] URL: [email or web address] Date: 04/18/2024 Prepared by: Susannah Daring  Exercises - Supine Quadricep Sets  - 3 x daily - 7 x weekly - 2 sets - 10 reps - 5 second hold - Small Range Straight Leg Raise  - 3 x daily - 7 x weekly - 2 sets - 5 reps - 3 seconds hold - Sit to Stand Without Arm Support  - 2 x daily - 7 x weekly - 1 sets - 5 reps - Seated Active Straight-Leg Raise  - 1 x daily - 7 x weekly - 2 sets - 5 reps - 3s hold  ASSESSMENT:  CLINICAL IMPRESSION: Patient needed VC and encouragement with there ex but was able to complete session.   OBJECTIVE IMPAIRMENTS: Abnormal gait, decreased activity tolerance, decreased endurance, decreased knowledge of condition, difficulty walking, decreased ROM, decreased strength, increased edema, impaired perceived functional ability, and pain.   ACTIVITY LIMITATIONS: bending, sitting, standing, squatting, stairs, and locomotion level  PARTICIPATION LIMITATIONS: community activity and occupation  PERSONAL FACTORS: Bil knee OA, stage 3 CKD, HTN are also affecting patient's functional outcome.   REHAB POTENTIAL: Good  CLINICAL DECISION MAKING: Evolving/moderate complexity  EVALUATION COMPLEXITY: Moderate   GOALS: Goals reviewed with patient? Yes  SHORT TERM GOALS: Target date: 05/05/2024 Peter Russell will be independent with his D1 home exercise program Baseline: Started 04/07/2024 Goal status: INITIAL  2.  Improve bilateral quadriceps strength to 75/50 pounds or better Baseline: 62.3/38.2 pounds respectively Goal status: INITIAL   LONG TERM GOALS: Target date:  06/02/2024  Improve patient specific functional scale to at least 6 Baseline: 4 Goal status: INITIAL  2.  Peter Russell will report bilateral knee pain consistently 2-3/10 on the numeric pain rating scale Baseline: 2-5/10 Goal status: INITIAL  3.  Improve bilateral quadriceps strength to 80 pounds  or greater Baseline: 62.3/38.2 pounds Goal status: INITIAL  4.  Peter Russell will be independent with his long-term maintenance home exercise program at discharge Baseline: Started 04/07/2024 Goal status: INITIAL  PLAN:  PT FREQUENCY: 2x/week  PT DURATION: 8 weeks  PLANNED INTERVENTIONS: 97750- Physical Performance Testing, 97110-Therapeutic exercises, 97530- Therapeutic activity, 97112- Neuromuscular re-education, 97535- Self Care, 02859- Manual therapy, [id number]- Gait training, [phone]- Electrical stimulation (unattended), 97016- Vasopneumatic device, Patient/Family education, Balance training, Stair training, and Cryotherapy  PLAN FOR NEXT SESSION: Emphasis on quadriceps strengthening.    Burnard Meth, PT 04/21/24  1:47 PM

## 2024-04-27 ENCOUNTER — Ambulatory Visit (INDEPENDENT_AMBULATORY_CARE_PROVIDER_SITE_OTHER)

## 2024-04-27 DIAGNOSIS — R262 Difficulty in walking, not elsewhere classified: Secondary | ICD-10-CM

## 2024-04-27 DIAGNOSIS — M6281 Muscle weakness (generalized): Secondary | ICD-10-CM

## 2024-04-27 DIAGNOSIS — M25562 Pain in left knee: Secondary | ICD-10-CM

## 2024-04-27 DIAGNOSIS — M25661 Stiffness of right knee, not elsewhere classified: Secondary | ICD-10-CM

## 2024-04-27 DIAGNOSIS — M25561 Pain in right knee: Secondary | ICD-10-CM

## 2024-04-27 DIAGNOSIS — M25662 Stiffness of left knee, not elsewhere classified: Secondary | ICD-10-CM

## 2024-04-27 DIAGNOSIS — G8929 Other chronic pain: Secondary | ICD-10-CM

## 2024-04-27 DIAGNOSIS — R600 Localized edema: Secondary | ICD-10-CM

## 2024-04-27 NOTE — Therapy (Signed)
 OUTPATIENT PHYSICAL THERAPY LOWER EXTREMITY TREATMENT   Patient Name: Peter Russell MRN: [id number] DOB:04-Dec-1945, 78 y.o., male Today's Date: 04/27/2024  END OF SESSION:  PT End of Session - 04/27/24 1404     Visit Number 4    Number of Visits 16    Date for PT Re-Evaluation 06/02/24    Authorization Type MEDICARE/BCBS    Progress Note Due on Visit 10    PT Start Time 1345    PT Stop Time 1423    PT Time Calculation (min) 38 min    Activity Tolerance Patient tolerated treatment well    Behavior During Therapy WFL for tasks assessed/performed             Past Medical History:  Diagnosis Date   Arthritis    sternum and knees   CKD (chronic kidney disease) stage 3, GFR 30-59 ml/min (HCC) 2020   Hypertension    Prostate cancer [facility])    Past Surgical History:  Procedure Laterality Date   APPENDECTOMY     cataract surgery Bilateral    06/2021, 08/2021   ESOPHAGEAL MANOMETRY N/A 04/08/2023   Procedure: ESOPHAGEAL MANOMETRY (EM);  Surgeon: Rollin Dover, MD;  Location: [facility];  Service: Gastroenterology;  Laterality: N/A;   NASAL SINUS SURGERY     PROSTATE CRYOABLATION     Patient Active Problem List   Diagnosis Date Noted   Primary osteoarthritis of right knee 03/22/2024   Primary osteoarthritis of left knee 03/22/2024   Sensorineural hearing loss, bilateral 08/27/2023   Impacted cerumen of both ears 08/27/2023   Aortic atherosclerosis (HCC) 08/25/2023   Achalasia 08/25/2023   History of prostate cancer 08/25/2023   Shortness of breath 05/24/2021   PVCs (premature ventricular contractions) 05/24/2021   Atypical chest pain 05/24/2021   Personal history of rheumatoid arthritis 02/15/2021   Malignant neoplasm metastatic to bone (HCC) 02/15/2021   Screen for colon cancer 02/15/2021   Vision impairment 02/15/2021   Dysphagia 02/15/2021   Abnormal abdominal CT scan 02/15/2021   Screening for heart disease 02/02/2020   Vaccine counseling 02/02/2020   Chronic  gout without tophus 02/02/2020   Advance directive discussed with patient 02/02/2020   Prostate cancer (HCC) 09/28/2019   Essential hypertension, benign 09/28/2019   Urine frequency 09/28/2019   OSA (obstructive sleep apnea) 09/28/2019   Vaccine refused by patient 09/28/2019   Medicare annual wellness visit, subsequent 09/28/2019    PCP: Alm RAMAN. Tysinger, PA-C  REFERRING PROVIDER: Kay CHRISTELLA Cummins, MD  REFERRING DIAG: M17.11 (ICD-10-CM) - Primary osteoarthritis of right knee M17.12 (ICD-10-CM) - Primary osteoarthritis of left knee  THERAPY DIAG:  Difficulty in walking, not elsewhere classified  Muscle weakness (generalized)  Localized edema  Stiffness of left knee, not elsewhere classified  Stiffness of right knee, not elsewhere classified  Chronic pain of both knees  Rationale for Evaluation and Treatment: Rehabilitation  ONSET DATE: A year ago  SUBJECTIVE:   SUBJECTIVE STATEMENT: Patient reports some soreness in the right shin after working on the HEP.  PERTINENT HISTORY: Bil knee OA, stage 3 CKD, HTN  Honest notes right > left knee pain of at least a year duration.  He is trying Meloxicam .  He notes trouble descending stairs and hills, difficulty squatting.    PAIN:  Are you having pain? Yes: NPRS scale: 2/10 walking in today Pain location: Bilateral medial knee joints Pain description: Ache, stiff and sore Aggravating factors: Prolonged postures and too much weightbearing Relieving factors: Change of position and gentle movement  PRECAUTIONS: None  RED FLAGS: None   WEIGHT BEARING RESTRICTIONS: No  FALLS:  Has patient fallen in last 6 months? 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  DIAGNOSTIC FINDINGS: IMPRESSION: 1. Severe degenerative changes of the RIGHT knee medial compartment. 2. Moderate degenerative changes of the LEFT knee medial compartment. 3. Subcortical cyst formation with possible patellar surface irregularity along the medial facet of the LEFT patella likely reflecting underlying cartilage deficiency/injury. 4. Small RIGHT knee joint effusion.  PATIENT SURVEYS:  PSFS: THE PATIENT SPECIFIC FUNCTIONAL SCALE  Place score of 0-10 (0 = unable to perform activity and 10 = able to perform activity at the same level as before injury or problem)  Activity Date: 04/07/2024    Ascending stairs 6    2.  Kneeling 2    3.  Descending stairs or a slope like his driveway 4    4.      Total Score 4      Total Score = Sum of activity scores/number of activities  Minimally Detectable Change: 3 points (for single activity); 2 points (for average score)  [name] (nd). The Patient Specific Functional Scale . Retrieved from [email or web address]   COGNITION: Overall cognitive status: Within functional limits for tasks assessed     SENSATION: Infrequent foot and ankle tingling  EDEMA:  Noted and not objectively assessed   LOWER EXTREMITY ROM:  Active ROM Left/Right 04/07/2024   Hip flexion    Hip extension    Hip abduction    Hip adduction    Hip internal rotation    Hip external rotation    Knee flexion 132/126   Knee extension 3/2   Ankle dorsiflexion    Ankle plantarflexion    Ankle inversion    Ankle eversion     (Blank rows = not tested)  LOWER EXTREMITY STRENGTH:  Assessed in pounds with a hand-held dynamometer Left/Right 04/07/2024   Hip flexion    Hip extension    Hip abduction    Hip adduction    Hip internal rotation    Hip external  rotation    Knee flexion    Knee extension 62.3/38.2   Ankle dorsiflexion    Ankle plantarflexion    Ankle inversion    Ankle eversion     (Blank rows = not tested)  GAIT: Distance walked: 100 feet Assistive device utilized: None Level of assistance: Complete Independence Comments: Rob notes he has difficulty with too much weightbearing or with longer periods of sitting  TREATMENT DATE:  04/27/24 TherEx:  Rec bike seat 3 level 0 5 min  Seated straight leg raise 2x10  with both LE 3# Quad sets 15x bilateral Slant board gastroc stretch 3x30s  There Act  Step ups/down/taps from  6 step 2x10 each leg  Heel to toe balance ( slight stagger) 25 sec x2 each leg Sit to stand with 2lb ball 3x5  04/21/24 TherEx:  Nustep level 3 for 6 minutes with bilat LE only  Seated straight leg raise 2x7with both LE 2# Quad sets 15x bilateral Slant board gastroc stretch 2x30s  There Act  Step ups/down/taps from 4 and 6 step 1x10 each leg  Heel to toe balance 15 sec x2 each leg  04/18/2024 TherEx:  Attempted UBE with bilat LE with patient having complaints of increased pain so discontinued  Nustep level 3 for 6 minutes with bilat LE only  Leg extension machine with bilat LE 2x6 with focus on slow and controlled movement  Seated straight leg raise 2x10 with both LE  PT providing verbal cues for decreasing posterior lean and focus on appropriate knee extension  Tap downs from 2 step 1x10 each leg  Slant board gastroc stretch 2x30s  PT discussed HEP, returning to walking, how exercise/strength training impact bone density, why downhill/eccentric movement is more difficult   04/07/2024 Quadriceps sets 2 sets of 10 for 5 seconds Seated straight leg raises 2 sets of 5 for 3 seconds  Functional Activities: Sit to stand 5 times, hands as needed  [id number]: Reviewed knee anatomy;  imaging; exam findings and his day 1 home exercise program; discussed injections may be an option in addition to or and lieu of physical therapy and that his worst case scenario is that he will get stronger in preparation for additional medical interventions if needed in the future   PATIENT EDUCATION:  Education details: See above Person educated: Patient Education method: Explanation, Demonstration, Tactile cues, Verbal cues, and Handouts Education comprehension: verbalized understanding, returned demonstration, verbal cues required, tactile cues required, and needs further education  HOME EXERCISE PROGRAM: Access Code: [email or web address] URL: [email or web address] Date: 04/18/2024 Prepared by: Susannah Daring  Exercises - Supine Quadricep Sets  - 3 x daily - 7 x weekly - 2 sets - 10 reps - 5 second hold - Small Range Straight Leg Raise  - 3 x daily - 7 x weekly - 2 sets - 5 reps - 3 seconds hold - Sit to Stand Without Arm Support  - 2 x daily - 7 x weekly - 1 sets - 5 reps - Seated Active Straight-Leg Raise  - 1 x daily - 7 x weekly - 2 sets - 5 reps - 3s hold  ASSESSMENT:  CLINICAL IMPRESSION: Patient able to increase resistance with LAQ.  Balance time  increased with staggered stance. OBJECTIVE IMPAIRMENTS: Abnormal gait, decreased activity tolerance, decreased endurance, decreased knowledge of condition, difficulty walking, decreased ROM, decreased strength, increased edema, impaired perceived functional ability, and pain.   ACTIVITY LIMITATIONS: bending, sitting, standing, squatting, stairs, and locomotion level  PARTICIPATION LIMITATIONS: community activity and occupation  PERSONAL FACTORS: Bil knee OA, stage 3 CKD, HTN are also affecting patient's functional outcome.   REHAB POTENTIAL: Good  CLINICAL DECISION MAKING: Evolving/moderate complexity  EVALUATION COMPLEXITY: Moderate   GOALS: Goals reviewed with patient? Yes  SHORT TERM GOALS: Target date:  05/05/2024 Thatcher will be independent with his D1 home exercise program Baseline: Started 04/07/2024 Goal status: INITIAL  2.  Improve bilateral quadriceps strength to 75/50 pounds  or better Baseline: 62.3/38.2 pounds respectively Goal status: INITIAL   LONG TERM GOALS: Target date: 06/02/2024  Improve patient specific functional scale to at least 6 Baseline: 4 Goal status: INITIAL  2.  Sigfredo will report bilateral knee pain consistently 2-3/10 on the numeric pain rating scale Baseline: 2-5/10 Goal status: INITIAL  3.  Improve bilateral quadriceps strength to 80 pounds or greater Baseline: 62.3/38.2 pounds Goal status: INITIAL  4.  Jahquez will be independent with his long-term maintenance home exercise program at discharge Baseline: Started 04/07/2024 Goal status: INITIAL  PLAN:  PT FREQUENCY: 2x/week  PT DURATION: 8 weeks  PLANNED INTERVENTIONS: 97750- Physical Performance Testing, 97110-Therapeutic exercises, 97530- Therapeutic activity, 97112- Neuromuscular re-education, 97535- Self Care, 02859- Manual therapy, [id number]- Gait training, [phone]- Electrical stimulation (unattended), [id number]- Vasopneumatic device, Patient/Family education, Balance training, Stair training, and Cryotherapy  PLAN FOR NEXT SESSION: Emphasis on quadriceps strengthening.    Burnard Meth, PT 04/27/24  2:28 PM

## 2024-04-28 NOTE — Therapy (Signed)
 OUTPATIENT PHYSICAL THERAPY LOWER EXTREMITY TREATMENT   Patient Name: Peter Russell MRN: [id number] DOB:1945-09-16, 78 y.o., male Today's Date: 04/29/2024  END OF SESSION:  PT End of Session - 04/29/24 1147     Visit Number 5    Number of Visits 16    Date for Recertification  06/02/24    Authorization Type MEDICARE/BCBS    Progress Note Due on Visit 10    PT Start Time 1147    PT Stop Time 1230    PT Time Calculation (min) 43 min    Activity Tolerance Patient tolerated treatment well    Behavior During Therapy WFL for tasks assessed/performed            Past Medical History:  Diagnosis Date   Arthritis    sternum and knees   CKD (chronic kidney disease) stage 3, GFR 30-59 ml/min (HCC) 2020   Hypertension    Prostate cancer [facility])    Past Surgical History:  Procedure Laterality Date   APPENDECTOMY     cataract surgery Bilateral    06/2021, 08/2021   ESOPHAGEAL MANOMETRY N/A 04/08/2023   Procedure: ESOPHAGEAL MANOMETRY (EM);  Surgeon: Rollin Dover, MD;  Location: [facility];  Service: Gastroenterology;  Laterality: N/A;   NASAL SINUS SURGERY     PROSTATE CRYOABLATION     Patient Active Problem List   Diagnosis Date Noted   Primary osteoarthritis of right knee 03/22/2024   Primary osteoarthritis of left knee 03/22/2024   Sensorineural hearing loss, bilateral 08/27/2023   Impacted cerumen of both ears 08/27/2023   Aortic atherosclerosis (HCC) 08/25/2023   Achalasia 08/25/2023   History of prostate cancer 08/25/2023   Shortness of breath 05/24/2021   PVCs (premature ventricular contractions) 05/24/2021   Atypical chest pain 05/24/2021   Personal history of rheumatoid arthritis 02/15/2021   Malignant neoplasm metastatic to bone (HCC) 02/15/2021   Screen for colon cancer 02/15/2021   Vision impairment 02/15/2021   Dysphagia 02/15/2021   Abnormal abdominal CT scan 02/15/2021   Screening for heart disease 02/02/2020   Vaccine counseling 02/02/2020   Chronic  gout without tophus 02/02/2020   Advance directive discussed with patient 02/02/2020   Prostate cancer (HCC) 09/28/2019   Essential hypertension, benign 09/28/2019   Urine frequency 09/28/2019   OSA (obstructive sleep apnea) 09/28/2019   Vaccine refused by patient 09/28/2019   Medicare annual wellness visit, subsequent 09/28/2019    PCP: Alm RAMAN. Tysinger, PA-C  REFERRING PROVIDER: Kay CHRISTELLA Cummins, MD  REFERRING DIAG: M17.11 (ICD-10-CM) - Primary osteoarthritis of right knee M17.12 (ICD-10-CM) - Primary osteoarthritis of left knee  THERAPY DIAG:  Difficulty in walking, not elsewhere classified  Muscle weakness (generalized)  Localized edema  Stiffness of left knee, not elsewhere classified  Stiffness of right knee, not elsewhere classified  Chronic pain of both knees  Rationale for Evaluation and Treatment: Rehabilitation  ONSET DATE: A year ago  SUBJECTIVE:   SUBJECTIVE STATEMENT: Patient reporting that going up stairs is still terrible. Pain/soreness rated at 0/10 with walking around, 8/10 with steps.  PERTINENT HISTORY: Bil knee OA, stage 3 CKD, HTN  Viviano notes right > left knee pain of at least a year duration.  He is trying Meloxicam .  He notes trouble descending stairs and hills, difficulty squatting.    PAIN:  Are you having pain? Yes: NPRS scale: 2/10 walking in today Pain location: Bilateral medial knee joints Pain description: Ache, stiff and sore Aggravating factors: Prolonged postures and too much weightbearing Relieving factors: Change of  position and gentle movement  PRECAUTIONS: None  RED FLAGS: None   WEIGHT BEARING RESTRICTIONS: No  FALLS:  Has patient fallen in last 6 months? 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  DIAGNOSTIC FINDINGS: IMPRESSION: 1. Severe degenerative changes of the RIGHT knee medial compartment. 2. Moderate degenerative changes of the LEFT knee medial compartment. 3. Subcortical cyst formation with possible patellar surface irregularity along the medial facet of the LEFT patella likely reflecting underlying cartilage deficiency/injury. 4. Small RIGHT knee joint effusion.  PATIENT SURVEYS:  PSFS: THE PATIENT SPECIFIC FUNCTIONAL SCALE  Place score of 0-10 (0 = unable to perform activity and 10 = able to perform activity at the same level as before injury or problem)  Activity Date: 04/07/2024    Ascending stairs 6    2.  Kneeling 2    3.  Descending stairs or a slope like his driveway 4    4.      Total Score 4      Total Score = Sum of activity scores/number of activities  Minimally Detectable Change: 3 points (for single activity); 2 points (for average score)  [name] [facility] [facility] (nd). The Patient Specific Functional Scale . Retrieved from [email or web address]   COGNITION: Overall cognitive status: Within functional limits for tasks assessed     SENSATION: Infrequent foot and ankle tingling  EDEMA:  Noted and not objectively assessed   LOWER EXTREMITY ROM:  Active ROM Left/Right 04/07/2024   Hip flexion    Hip extension    Hip abduction    Hip adduction    Hip internal rotation    Hip external rotation    Knee flexion 132/126   Knee extension 3/2   Ankle dorsiflexion    Ankle plantarflexion    Ankle inversion    Ankle eversion     (Blank rows = not tested)  LOWER EXTREMITY STRENGTH:  Assessed in pounds with a hand-held dynamometer Left/Right 04/07/2024   Hip flexion    Hip extension    Hip abduction     Hip adduction    Hip internal rotation    Hip external rotation    Knee flexion    Knee extension 62.3/38.2   Ankle dorsiflexion    Ankle plantarflexion    Ankle inversion    Ankle eversion     (Blank rows = not tested)  GAIT: Distance walked: 100 feet Assistive device utilized: None Level of assistance: Complete Independence Comments: Miko notes he has difficulty with too much weightbearing or with longer periods of sitting  TREATMENT DATE:  04/29/2024 TherEx: Nustep with bilat LE level 4 for 6 minutes Seated LAQ with 2# weights on ankles 2x8 with 5s eccentric  Standing hamstring curls with 2# weight on ankles 2x10   TherAct: Sit<>stand tap downs to 23 mat 3x6; attempted at 20, 21, and 22 with high level of difficulty and slight pain   Neuro Re-Ed:  Semi tandem stance in parallel bars 1x30s each LE  Narrow stance with horizontal head turns 1x10 turns; attempted typical stance with no difficulty noted  Slight increase is sway and ankle strategy Narrow stance with vertical head turns 1x10 turns; attempted typical stance with no difficulty noted Slight increase is sway and ankle strategy Standing reciprocal taps to cone to challenge single leg stance and motor control 2x20 taps ; intermittent verbal cues provided for decreasing speed of movement and decreasing lateral trunk lean with moderate carryover from patient  PT educated patient on 3 balance systems, importance of visual system, and balance strategies   04/27/24 TherEx:  Rec bike seat 3 level 0 5 min  Seated straight leg raise 2x10  with both LE 3# Quad sets 15x bilateral Slant board gastroc stretch 3x30s  There Act  Step ups/down/taps from  6 step 2x10 each leg  Heel to toe balance ( slight stagger) 25 sec x2 each leg Sit to stand with 2lb ball 3x5  04/21/24 TherEx:  Nustep level 3 for 6  minutes with bilat LE only  Seated straight leg raise 2x7with both LE 2# Quad sets 15x bilateral Slant board gastroc stretch 2x30s  There Act  Step ups/down/taps from 4 and 6 step 1x10 each leg  Heel to toe balance 15 sec x2 each leg  04/18/2024 TherEx:  Attempted UBE with bilat LE with patient having complaints of increased pain so discontinued  Nustep level 3 for 6 minutes with bilat LE only  Leg extension machine with bilat LE 2x6 with focus on slow and controlled movement  Seated straight leg raise 2x10 with both LE  PT providing verbal cues for decreasing posterior lean and focus on appropriate knee extension  Tap downs from 2 step 1x10 each leg  Slant board gastroc stretch 2x30s  PT discussed HEP, returning to walking, how exercise/strength training impact bone density, why downhill/eccentric movement is more difficult     PATIENT EDUCATION:  Education details: See above Person educated: Patient Education method: Explanation, Demonstration, Tactile cues, Verbal cues, and Handouts Education comprehension: verbalized understanding, returned demonstration, verbal cues required, tactile cues required, and needs further education  HOME EXERCISE PROGRAM: Access Code: [email or web address] URL: [email or web address] Date: 04/18/2024 Prepared by: Susannah Daring  Exercises - Supine Quadricep Sets  - 3 x daily - 7 x weekly - 2 sets - 10 reps - 5 second hold - Small Range Straight Leg Raise  - 3 x daily - 7 x weekly - 2 sets - 5 reps - 3 seconds hold - Sit to Stand Without Arm Support  - 2 x daily - 7 x weekly - 1 sets - 5 reps - Seated Active Straight-Leg Raise  - 1 x daily - 7 x weekly - 2 sets - 5 reps - 3s hold  ASSESSMENT:  CLINICAL IMPRESSION: Patient arrived to session noting continued difficulty and pain with stairs as well as fear of falling down stairs. Patient tolerated all activities this date and will benefit from slow increase in ROM/strengthening due to pain and  strength deficits. Patient will continue to benefit from skilled PT. OBJECTIVE IMPAIRMENTS: Abnormal  gait, decreased activity tolerance, decreased endurance, decreased knowledge of condition, difficulty walking, decreased ROM, decreased strength, increased edema, impaired perceived functional ability, and pain.   ACTIVITY LIMITATIONS: bending, sitting, standing, squatting, stairs, and locomotion level  PARTICIPATION LIMITATIONS: community activity and occupation  PERSONAL FACTORS: Bil knee OA, stage 3 CKD, HTN are also affecting patient's functional outcome.   REHAB POTENTIAL: Good  CLINICAL DECISION MAKING: Evolving/moderate complexity  EVALUATION COMPLEXITY: Moderate   GOALS: Goals reviewed with patient? Yes  SHORT TERM GOALS: Target date: 05/05/2024 Aaran will be independent with his D1 home exercise program Baseline: Started 04/07/2024 Goal status: INITIAL  2.  Improve bilateral quadriceps strength to 75/50 pounds or better Baseline: 62.3/38.2 pounds respectively Goal status: INITIAL   LONG TERM GOALS: Target date: 06/02/2024  Improve patient specific functional scale to at least 6 Baseline: 4 Goal status: INITIAL  2.  Danny will report bilateral knee pain consistently 2-3/10 on the numeric pain rating scale Baseline: 2-5/10 Goal status: INITIAL  3.  Improve bilateral quadriceps strength to 80 pounds or greater Baseline: 62.3/38.2 pounds Goal status: INITIAL  4.  Breyer will be independent with his long-term maintenance home exercise program at discharge Baseline: Started 04/07/2024 Goal status: INITIAL  PLAN:  PT FREQUENCY: 2x/week  PT DURATION: 8 weeks  PLANNED INTERVENTIONS: 97750- Physical Performance Testing, 97110-Therapeutic exercises, 97530- Therapeutic activity, 97112- Neuromuscular re-education, 97535- Self Care, 02859- Manual therapy, [phone]- Gait training, [phone]- Electrical stimulation (unattended), 97016- Vasopneumatic device, Patient/Family  education, Balance training, Stair training, and Cryotherapy  PLAN FOR NEXT SESSION: Emphasis on quadriceps strengthening, decreased ROM with sit<>stands/squats, flexibility, balance   Susannah Daring, PT, DPT 04/29/24 4:07 PM

## 2024-04-29 ENCOUNTER — Ambulatory Visit

## 2024-04-29 DIAGNOSIS — G8929 Other chronic pain: Secondary | ICD-10-CM

## 2024-04-29 DIAGNOSIS — M25661 Stiffness of right knee, not elsewhere classified: Secondary | ICD-10-CM

## 2024-04-29 DIAGNOSIS — M25662 Stiffness of left knee, not elsewhere classified: Secondary | ICD-10-CM

## 2024-04-29 DIAGNOSIS — M25562 Pain in left knee: Secondary | ICD-10-CM

## 2024-04-29 DIAGNOSIS — R262 Difficulty in walking, not elsewhere classified: Secondary | ICD-10-CM | POA: Diagnosis not present

## 2024-04-29 DIAGNOSIS — M6281 Muscle weakness (generalized): Secondary | ICD-10-CM | POA: Diagnosis not present

## 2024-04-29 DIAGNOSIS — M25561 Pain in right knee: Secondary | ICD-10-CM

## 2024-04-29 DIAGNOSIS — R600 Localized edema: Secondary | ICD-10-CM | POA: Diagnosis not present

## 2024-05-04 ENCOUNTER — Ambulatory Visit: Admitting: Rehabilitative and Restorative Service Providers"

## 2024-05-04 ENCOUNTER — Encounter: Payer: Self-pay | Admitting: Rehabilitative and Restorative Service Providers"

## 2024-05-04 DIAGNOSIS — M25561 Pain in right knee: Secondary | ICD-10-CM

## 2024-05-04 DIAGNOSIS — R600 Localized edema: Secondary | ICD-10-CM

## 2024-05-04 DIAGNOSIS — G8929 Other chronic pain: Secondary | ICD-10-CM

## 2024-05-04 DIAGNOSIS — M25662 Stiffness of left knee, not elsewhere classified: Secondary | ICD-10-CM

## 2024-05-04 DIAGNOSIS — M6281 Muscle weakness (generalized): Secondary | ICD-10-CM

## 2024-05-04 DIAGNOSIS — M25661 Stiffness of right knee, not elsewhere classified: Secondary | ICD-10-CM

## 2024-05-04 DIAGNOSIS — M25562 Pain in left knee: Secondary | ICD-10-CM

## 2024-05-04 DIAGNOSIS — R262 Difficulty in walking, not elsewhere classified: Secondary | ICD-10-CM

## 2024-05-04 NOTE — Therapy (Signed)
 OUTPATIENT PHYSICAL THERAPY LOWER EXTREMITY TREATMENT   Patient Name: Peter Russell MRN: [id number] DOB:August 11, 1946, 78 y.o., male Today's Date: 05/04/2024  END OF SESSION:  PT End of Session - 05/04/24 1345     Visit Number 6    Number of Visits 16    Date for Recertification  06/02/24    Authorization Type MEDICARE/BCBS    Progress Note Due on Visit 10    PT Start Time 1306    PT Stop Time 1345    PT Time Calculation (min) 39 min    Activity Tolerance Patient tolerated treatment well;No increased pain    Behavior During Therapy WFL for tasks assessed/performed             Past Medical History:  Diagnosis Date   Arthritis    sternum and knees   CKD (chronic kidney disease) stage 3, GFR 30-59 ml/min (HCC) 2020   Hypertension    Prostate cancer [facility])    Past Surgical History:  Procedure Laterality Date   APPENDECTOMY     cataract surgery Bilateral    06/2021, 08/2021   ESOPHAGEAL MANOMETRY N/A 04/08/2023   Procedure: ESOPHAGEAL MANOMETRY (EM);  Surgeon: Rollin Dover, MD;  Location: [facility];  Service: Gastroenterology;  Laterality: N/A;   NASAL SINUS SURGERY     PROSTATE CRYOABLATION     Patient Active Problem List   Diagnosis Date Noted   Primary osteoarthritis of right knee 03/22/2024   Primary osteoarthritis of left knee 03/22/2024   Sensorineural hearing loss, bilateral 08/27/2023   Impacted cerumen of both ears 08/27/2023   Aortic atherosclerosis 08/25/2023   Achalasia 08/25/2023   History of prostate cancer 08/25/2023   Shortness of breath 05/24/2021   PVCs (premature ventricular contractions) 05/24/2021   Atypical chest pain 05/24/2021   Personal history of rheumatoid arthritis 02/15/2021   Malignant neoplasm metastatic to bone (HCC) 02/15/2021   Screen for colon cancer 02/15/2021   Vision impairment 02/15/2021   Dysphagia 02/15/2021   Abnormal abdominal CT scan 02/15/2021   Screening for heart disease 02/02/2020   Vaccine counseling 02/02/2020    Chronic gout without tophus 02/02/2020   Advance directive discussed with patient 02/02/2020   Prostate cancer (HCC) 09/28/2019   Essential hypertension, benign 09/28/2019   Urine frequency 09/28/2019   OSA (obstructive sleep apnea) 09/28/2019   Vaccine refused by patient 09/28/2019   Medicare annual wellness visit, subsequent 09/28/2019    PCP: Alm RAMAN. Tysinger, PA-C  REFERRING PROVIDER: Kay CHRISTELLA Cummins, MD  REFERRING DIAG: M17.11 (ICD-10-CM) - Primary osteoarthritis of right knee M17.12 (ICD-10-CM) - Primary osteoarthritis of left knee  THERAPY DIAG:  Difficulty in walking, not elsewhere classified  Muscle weakness (generalized)  Localized edema  Stiffness of left knee, not elsewhere classified  Stiffness of right knee, not elsewhere classified  Chronic pain of both knees  Rationale for Evaluation and Treatment: Rehabilitation  ONSET DATE: A year ago  SUBJECTIVE:   SUBJECTIVE STATEMENT: Hoby notes consistently inconsistent HEP compliance.  He is doing his exercises, but admits compliance can increase.  PERTINENT HISTORY: Bil knee OA, stage 3 CKD, HTN  Oseas notes right > left knee pain of at least a year duration.  He is trying Meloxicam .  He notes trouble descending stairs and hills, difficulty squatting.    PAIN:  Are you having pain? Yes: NPRS scale: 2-8/10 this week Pain location: Bilateral medial knee joints Pain description: Ache, stiff and sore Aggravating factors: Prolonged postures and too much weightbearing Relieving factors: Change of position  and gentle movement  PRECAUTIONS: None  RED FLAGS: None   WEIGHT BEARING RESTRICTIONS: No  FALLS:  Has patient fallen in last 6 months? 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  DIAGNOSTIC FINDINGS: IMPRESSION: 1. Severe degenerative changes of the RIGHT knee medial compartment. 2. Moderate degenerative changes of the LEFT knee medial compartment. 3. Subcortical cyst formation with possible patellar surface irregularity along the medial facet of the LEFT patella likely reflecting underlying cartilage deficiency/injury. 4. Small RIGHT knee joint effusion.  PATIENT SURVEYS:  PSFS: THE PATIENT SPECIFIC FUNCTIONAL SCALE  Place score of 0-10 (0 = unable to perform activity and 10 = able to perform activity at the same level as before injury or problem)  Activity Date: 04/07/2024    Ascending stairs 6    2.  Kneeling 2    3.  Descending stairs or a slope like his driveway 4    4.      Total Score 4      Total Score = Sum of activity scores/number of activities  Minimally Detectable Change: 3 points (for single activity); 2 points (for average score)  [name] (nd). The Patient Specific Functional Scale . Retrieved from [email or web address]   COGNITION: Overall cognitive status: Within functional limits for tasks assessed     SENSATION: Infrequent foot and ankle tingling  EDEMA:  Noted and not objectively assessed   LOWER EXTREMITY ROM:  Active ROM Left/Right 04/07/2024   Hip flexion    Hip extension    Hip abduction    Hip adduction    Hip internal rotation    Hip external rotation    Knee flexion 132/126   Knee extension 3/2   Ankle dorsiflexion    Ankle plantarflexion    Ankle inversion    Ankle eversion     (Blank rows = not tested)  LOWER EXTREMITY STRENGTH:  Assessed in pounds with a hand-held dynamometer Left/Right 04/07/2024 Left/Right 05/04/2024  Hip flexion    Hip extension     Hip abduction    Hip adduction    Hip internal rotation    Hip external rotation    Knee flexion    Knee extension 62.3/38.2 67.8/47.4  Ankle dorsiflexion    Ankle plantarflexion    Ankle inversion    Ankle eversion     (Blank rows = not tested)  GAIT: Distance walked: 100 feet Assistive device utilized: None Level of assistance: Complete Independence Comments: Jese notes he has difficulty with too much weightbearing or with longer periods of sitting  TREATMENT DATE:  05/04/2024 Nu Step Level 6 for 5 minutes Seated straight leg raises 4 sets of 5 for 2 seconds (lots of PT feedback and correction required)  97535: Education regarding anatomy (muscle and joint); review of imaging; discussed possible benefits of injections (cortisone vs hyaluronic acid) and recommended he speak with Dr. Jerri to see if he is a candidate; reviewed the importance of long-term consistent HEP compliance to improve quadriceps strength for functional and pain progress   04/29/2024 TherEx: Nustep with bilat LE level 4 for 6 minutes Seated LAQ with 2# weights on ankles 2x8 with 5s eccentric  Standing hamstring curls with 2# weight on ankles 2x10   TherAct: Sit<>stand tap downs to 23 mat 3x6; attempted at 20, 21, and 22 with high level of difficulty and slight pain   Neuro Re-Ed:  Semi tandem stance in parallel bars 1x30s each LE  Narrow stance with horizontal head turns 1x10 turns; attempted typical stance with no difficulty noted  Slight increase is sway and ankle strategy Narrow stance with vertical head turns 1x10 turns; attempted typical stance with no difficulty noted Slight increase is sway and ankle strategy Standing reciprocal taps to cone to challenge single leg stance and motor control 2x20 taps ; intermittent verbal cues provided for decreasing speed of movement and  decreasing lateral trunk lean with moderate carryover from patient  PT educated patient on 3 balance systems, importance of visual system, and balance strategies    04/27/24 TherEx:  Rec bike seat 3 level 0 5 min  Seated straight leg raise 2x10  with both LE 3# Quad sets 15x bilateral Slant board gastroc stretch 3x30s  There Act  Step ups/down/taps from  6 step 2x10 each leg  Heel to toe balance ( slight stagger) 25 sec x2 each leg Sit to stand with 2lb ball 3x5    PATIENT EDUCATION:  Education details: See above Person educated: Patient Education method: Explanation, Demonstration, Tactile cues, Verbal cues, and Handouts Education comprehension: verbalized understanding, returned demonstration, verbal cues required, tactile cues required, and needs further education  HOME EXERCISE PROGRAM: Access Code: [email or web address] URL: [email or web address] Date: 04/18/2024 Prepared by: Susannah Daring  Exercises - Supine Quadricep Sets  - 3 x daily - 7 x weekly - 2 sets - 10 reps - 5 second hold - Small Range Straight Leg Raise  - 3 x daily - 7 x weekly - 2 sets - 5 reps - 3 seconds hold - Sit to Stand Without Arm Support  - 2 x daily - 7 x weekly - 1 sets - 5 reps - Seated Active Straight-Leg Raise  - 1 x daily - 7 x weekly - 2 sets - 5 reps - 3s hold  ASSESSMENT:  CLINICAL IMPRESSION: Salvador had questions about his program, prognosis and treatment options that I attempted to address today.  Objectively, quadriceps strength is improving.  Elspeth and I discussed that normal quadriceps strength for him would be > 40% body weight or over 80 pounds and (although improved from evaluation) he is shy of this value and will benefit from continued strength work.    OBJECTIVE IMPAIRMENTS: Abnormal gait, decreased activity tolerance, decreased endurance, decreased knowledge of condition, difficulty walking, decreased ROM, decreased strength, increased edema, impaired perceived functional  ability, and pain.   ACTIVITY LIMITATIONS: bending, sitting, standing, squatting, stairs, and locomotion level  PARTICIPATION LIMITATIONS: community activity and occupation  PERSONAL FACTORS: Bil knee OA, stage 3 CKD, HTN are also affecting patient's functional outcome.   REHAB  POTENTIAL: Good  CLINICAL DECISION MAKING: Evolving/moderate complexity  EVALUATION COMPLEXITY: Moderate   GOALS: Goals reviewed with patient? Yes  SHORT TERM GOALS: Target date: 05/05/2024 Aureliano will be independent with his D1 home exercise program Baseline: Started 04/07/2024 Goal status: On Going 05/04/2024  2.  Improve bilateral quadriceps strength to 75/50 pounds or better Baseline: 62.3/38.2 pounds respectively Goal status: On Going 05/04/2024   LONG TERM GOALS: Target date: 06/02/2024  Improve patient specific functional scale to at least 6 Baseline: 4 Goal status: INITIAL  2.  Bennet will report bilateral knee pain consistently 2-3/10 on the numeric pain rating scale Baseline: 2-5/10 Goal status: INITIAL  3.  Improve bilateral quadriceps strength to 80 pounds or greater Baseline: 62.3/38.2 pounds Goal status: INITIAL  4.  Kolson will be independent with his long-term maintenance home exercise program at discharge Baseline: Started 04/07/2024 Goal status: INITIAL  PLAN:  PT FREQUENCY: 2x/week  PT DURATION: 8 weeks  PLANNED INTERVENTIONS: 97750- Physical Performance Testing, 97110-Therapeutic exercises, 97530- Therapeutic activity, 97112- Neuromuscular re-education, 97535- Self Care, 02859- Manual therapy, [id number]- Gait training, [phone]- Electrical stimulation (unattended), 97016- Vasopneumatic device, Patient/Family education, Balance training, Stair training, and Cryotherapy  PLAN FOR NEXT SESSION: Emphasis on quadriceps strengthening.   Myer LELON Ivory PT, MPT 05/04/24 3:45 PM

## 2024-05-06 ENCOUNTER — Ambulatory Visit (INDEPENDENT_AMBULATORY_CARE_PROVIDER_SITE_OTHER): Admitting: Rehabilitative and Restorative Service Providers"

## 2024-05-06 ENCOUNTER — Encounter: Payer: Self-pay | Admitting: Rehabilitative and Restorative Service Providers"

## 2024-05-06 DIAGNOSIS — M6281 Muscle weakness (generalized): Secondary | ICD-10-CM

## 2024-05-06 DIAGNOSIS — M25662 Stiffness of left knee, not elsewhere classified: Secondary | ICD-10-CM

## 2024-05-06 DIAGNOSIS — G8929 Other chronic pain: Secondary | ICD-10-CM

## 2024-05-06 DIAGNOSIS — R262 Difficulty in walking, not elsewhere classified: Secondary | ICD-10-CM | POA: Diagnosis not present

## 2024-05-06 DIAGNOSIS — R600 Localized edema: Secondary | ICD-10-CM

## 2024-05-06 DIAGNOSIS — M25562 Pain in left knee: Secondary | ICD-10-CM

## 2024-05-06 DIAGNOSIS — M25661 Stiffness of right knee, not elsewhere classified: Secondary | ICD-10-CM

## 2024-05-06 DIAGNOSIS — M25561 Pain in right knee: Secondary | ICD-10-CM

## 2024-05-06 NOTE — Therapy (Signed)
 OUTPATIENT PHYSICAL THERAPY LOWER EXTREMITY TREATMENT   Patient Name: Peter Russell MRN: [id number] DOB:07/28/1946, 78 y.o., male Today's Date: 05/06/2024  END OF SESSION:  PT End of Session - 05/06/24 1517     Visit Number 7    Number of Visits 16    Date for Recertification  06/02/24    Authorization Type MEDICARE/BCBS    Progress Note Due on Visit 10    PT Start Time 1516    PT Stop Time 1559    PT Time Calculation (min) 43 min    Activity Tolerance Patient tolerated treatment well;No increased pain;Patient limited by pain    Behavior During Therapy [facility] for tasks assessed/performed              Past Medical History:  Diagnosis Date   Arthritis    sternum and knees   CKD (chronic kidney disease) stage 3, GFR 30-59 ml/min (HCC) 2020   Hypertension    Prostate cancer [facility])    Past Surgical History:  Procedure Laterality Date   APPENDECTOMY     cataract surgery Bilateral    06/2021, 08/2021   ESOPHAGEAL MANOMETRY N/A 04/08/2023   Procedure: ESOPHAGEAL MANOMETRY (EM);  Surgeon: Rollin Dover, MD;  Location: [facility];  Service: Gastroenterology;  Laterality: N/A;   NASAL SINUS SURGERY     PROSTATE CRYOABLATION     Patient Active Problem List   Diagnosis Date Noted   Primary osteoarthritis of right knee 03/22/2024   Primary osteoarthritis of left knee 03/22/2024   Sensorineural hearing loss, bilateral 08/27/2023   Impacted cerumen of both ears 08/27/2023   Aortic atherosclerosis 08/25/2023   Achalasia 08/25/2023   History of prostate cancer 08/25/2023   Shortness of breath 05/24/2021   PVCs (premature ventricular contractions) 05/24/2021   Atypical chest pain 05/24/2021   Personal history of rheumatoid arthritis 02/15/2021   Malignant neoplasm metastatic to bone (HCC) 02/15/2021   Screen for colon cancer 02/15/2021   Vision impairment 02/15/2021   Dysphagia 02/15/2021   Abnormal abdominal CT scan 02/15/2021   Screening for heart disease 02/02/2020    Vaccine counseling 02/02/2020   Chronic gout without tophus 02/02/2020   Advance directive discussed with patient 02/02/2020   Prostate cancer (HCC) 09/28/2019   Essential hypertension, benign 09/28/2019   Urine frequency 09/28/2019   OSA (obstructive sleep apnea) 09/28/2019   Vaccine refused by patient 09/28/2019   Medicare annual wellness visit, subsequent 09/28/2019    PCP: Alm RAMAN. Tysinger, PA-C  REFERRING PROVIDER: Kay CHRISTELLA Cummins, MD  REFERRING DIAG: M17.11 (ICD-10-CM) - Primary osteoarthritis of right knee M17.12 (ICD-10-CM) - Primary osteoarthritis of left knee  THERAPY DIAG:  Difficulty in walking, not elsewhere classified  Muscle weakness (generalized)  Localized edema  Stiffness of left knee, not elsewhere classified  Stiffness of right knee, not elsewhere classified  Chronic pain of both knees  Rationale for Evaluation and Treatment: Rehabilitation  ONSET DATE: A year ago  SUBJECTIVE:   SUBJECTIVE STATEMENT: Qusai had a procedure yesterday so HEP compliance was poor.  He was encouraged to increase his HEP participation over the weekend.  PERTINENT HISTORY: Bil knee OA, stage 3 CKD, HTN  Bolton notes right > left knee pain of at least a year duration.  He is trying Meloxicam .  He notes trouble descending stairs and hills, difficulty squatting.    PAIN:  Are you having pain? Yes: NPRS scale: 0-4/10 this week Pain location: Bilateral medial knee joints Pain description: Ache, stiff and sore Aggravating factors: Prolonged postures  and too much weightbearing Relieving factors: Change of position and gentle movement  PRECAUTIONS: None  RED FLAGS: None   WEIGHT BEARING RESTRICTIONS: No  FALLS:  Has patient fallen in last 6 months? 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  DIAGNOSTIC FINDINGS: IMPRESSION: 1. Severe degenerative changes of the RIGHT knee medial compartment. 2. Moderate degenerative changes of the LEFT knee medial compartment. 3. Subcortical cyst formation with possible patellar surface irregularity along the medial facet of the LEFT patella likely reflecting underlying cartilage deficiency/injury. 4. Small RIGHT knee joint effusion.  PATIENT SURVEYS:  PSFS: THE PATIENT SPECIFIC FUNCTIONAL SCALE  Place score of 0-10 (0 = unable to perform activity and 10 = able to perform activity at the same level as before injury or problem)  Activity Date: 04/07/2024    Ascending stairs 6    2.  Kneeling 2    3.  Descending stairs or a slope like his driveway 4    4.      Total Score 4      Total Score = Sum of activity scores/number of activities  Minimally Detectable Change: 3 points (for single activity); 2 points (for average score)  [name] (nd). The Patient Specific Functional Scale . Retrieved from [email or web address]   COGNITION: Overall cognitive status: Within functional limits for tasks assessed     SENSATION: Infrequent foot and ankle tingling  EDEMA:  Noted and not objectively assessed   LOWER EXTREMITY ROM:  Active ROM Left/Right 04/07/2024   Hip flexion    Hip extension    Hip abduction    Hip adduction    Hip internal rotation    Hip external rotation    Knee flexion 132/126   Knee extension 3/2   Ankle dorsiflexion    Ankle plantarflexion    Ankle inversion    Ankle eversion     (Blank rows = not tested)  LOWER EXTREMITY STRENGTH:  Assessed in pounds with a hand-held dynamometer Left/Right 04/07/2024 Left/Right  05/04/2024  Hip flexion    Hip extension    Hip abduction    Hip adduction    Hip internal rotation    Hip external rotation    Knee flexion    Knee extension 62.3/38.2 67.8/47.4  Ankle dorsiflexion    Ankle plantarflexion    Ankle inversion    Ankle eversion     (Blank rows = not tested)  GAIT: Distance walked: 100 feet Assistive device utilized: None Level of assistance: Complete Independence Comments: Pieter notes he has difficulty with too much weightbearing or with longer periods of sitting  TREATMENT DATE:  05/06/2024 Nu Step Level 6 for 5 minutes Seated straight leg raises 4 sets of 5 for 2 seconds (needed less PT feedback and correction) Seated knee extension machine 90-40 degrees (up both, down 1 leg only with slow eccentric contraction) 10# left and 5# right 10 reps each side  Functional Activities: Double Leg Press: 75# slow eccentric 10 reps Single Leg Press: 50# 10 reps right leg with slow eccentric contraction; 62# left 10 reps with slow eccentric  Neuromuscular re-education: Feet together on foam eyes open; head turning and eyes closed 2 x each 20 seconds   05/04/2024 Nu Step Level 6 for 5 minutes Seated straight leg raises 4 sets of 5 for 2 seconds (lots of PT feedback and correction required)  97535: Education regarding anatomy (muscle and joint); review of imaging; discussed possible benefits of injections (cortisone vs hyaluronic acid) and recommended he speak with Dr. Jerri to see if he is a candidate; reviewed the importance of long-term consistent HEP compliance to improve quadriceps strength for functional and pain progress   04/29/2024 TherEx: Nustep with bilat LE level 4 for 6 minutes Seated LAQ with 2# weights on ankles 2x8 with 5s eccentric  Standing hamstring curls with 2# weight on ankles 2x10   TherAct: Sit<>stand tap downs to  23 mat 3x6; attempted at 20, 21, and 22 with high level of difficulty and slight pain   Neuro Re-Ed:  Semi tandem stance in parallel bars 1x30s each LE  Narrow stance with horizontal head turns 1x10 turns; attempted typical stance with no difficulty noted  Slight increase is sway and ankle strategy Narrow stance with vertical head turns 1x10 turns; attempted typical stance with no difficulty noted Slight increase is sway and ankle strategy Standing reciprocal taps to cone to challenge single leg stance and motor control 2x20 taps ; intermittent verbal cues provided for decreasing speed of movement and decreasing lateral trunk lean with moderate carryover from patient  PT educated patient on 3 balance systems, importance of visual system, and balance strategies    PATIENT EDUCATION:  Education details: See above Person educated: Patient Education method: Explanation, Demonstration, Tactile cues, Verbal cues, and Handouts Education comprehension: verbalized understanding, returned demonstration, verbal cues required, tactile cues required, and needs further education  HOME EXERCISE PROGRAM: Access Code: [email or web address] URL: [email or web address] Date: 04/18/2024 Prepared by: Susannah Daring  Exercises - Supine Quadricep Sets  - 3 x daily - 7 x weekly - 2 sets - 10 reps - 5 second hold - Small Range Straight Leg Raise  - 3 x daily - 7 x weekly - 2 sets - 5 reps - 3 seconds hold - Sit to Stand Without Arm Support  - 2 x daily - 7 x weekly - 1 sets - 5 reps - Seated Active Straight-Leg Raise  - 1 x daily - 7 x weekly - 2 sets - 5 reps - 3s hold  ASSESSMENT:  CLINICAL IMPRESSION: Aubery did a good job with strength and functional progressions during today's visit.  I encouraged him to focus on his seated straight leg raises at home to improve quadriceps strength while minimizing joint compressive forces.  As quadriceps strength continues to improve, we will progress into more  dynamic functional activities like stairs and sit to stand.  [place] remains on track to meet long-term goals.  OBJECTIVE IMPAIRMENTS: Abnormal gait, decreased activity tolerance, decreased endurance, decreased knowledge of condition, difficulty walking, decreased ROM, decreased strength, increased edema, impaired perceived functional ability, and pain.  ACTIVITY LIMITATIONS: bending, sitting, standing, squatting, stairs, and locomotion level  PARTICIPATION LIMITATIONS: community activity and occupation  PERSONAL FACTORS: Bil knee OA, stage 3 CKD, HTN are also affecting patient's functional outcome.   REHAB POTENTIAL: Good  CLINICAL DECISION MAKING: Evolving/moderate complexity  EVALUATION COMPLEXITY: Moderate   GOALS: Goals reviewed with patient? Yes  SHORT TERM GOALS: Target date: 05/05/2024 Raylen will be independent with his D1 home exercise program Baseline: Started 04/07/2024 Goal status: On Going 05/04/2024  2.  Improve bilateral quadriceps strength to 75/50 pounds or better Baseline: 62.3/38.2 pounds respectively Goal status: On Going 05/04/2024   LONG TERM GOALS: Target date: 06/02/2024  Improve patient specific functional scale to at least 6 Baseline: 4 Goal status: INITIAL  2.  Deavin will report bilateral knee pain consistently 2-3/10 on the numeric pain rating scale Baseline: 2-5/10 Goal status: INITIAL  3.  Improve bilateral quadriceps strength to 80 pounds or greater Baseline: 62.3/38.2 pounds Goal status: INITIAL  4.  Stevon will be independent with his long-term maintenance home exercise program at discharge Baseline: Started 04/07/2024 Goal status: INITIAL  PLAN:  PT FREQUENCY: 2x/week  PT DURATION: 8 weeks  PLANNED INTERVENTIONS: 97750- Physical Performance Testing, 97110-Therapeutic exercises, 97530- Therapeutic activity, 97112- Neuromuscular re-education, 97535- Self Care, 02859- Manual therapy, [id number]- Gait training, [phone]- Electrical stimulation  (unattended), 97016- Vasopneumatic device, Patient/Family education, Balance training, Stair training, and Cryotherapy  PLAN FOR NEXT SESSION: Emphasis on quadriceps strengthening.   Myer LELON Ivory PT, MPT 05/06/24 4:10 PM

## 2024-05-11 ENCOUNTER — Ambulatory Visit (INDEPENDENT_AMBULATORY_CARE_PROVIDER_SITE_OTHER): Admitting: Rehabilitative and Restorative Service Providers"

## 2024-05-11 ENCOUNTER — Encounter: Payer: Self-pay | Admitting: Rehabilitative and Restorative Service Providers"

## 2024-05-11 DIAGNOSIS — R600 Localized edema: Secondary | ICD-10-CM

## 2024-05-11 DIAGNOSIS — M6281 Muscle weakness (generalized): Secondary | ICD-10-CM

## 2024-05-11 DIAGNOSIS — R262 Difficulty in walking, not elsewhere classified: Secondary | ICD-10-CM | POA: Diagnosis not present

## 2024-05-11 DIAGNOSIS — M25561 Pain in right knee: Secondary | ICD-10-CM

## 2024-05-11 DIAGNOSIS — M25662 Stiffness of left knee, not elsewhere classified: Secondary | ICD-10-CM

## 2024-05-11 DIAGNOSIS — M25661 Stiffness of right knee, not elsewhere classified: Secondary | ICD-10-CM

## 2024-05-11 DIAGNOSIS — M25562 Pain in left knee: Secondary | ICD-10-CM

## 2024-05-11 DIAGNOSIS — G8929 Other chronic pain: Secondary | ICD-10-CM

## 2024-05-11 NOTE — Therapy (Signed)
 OUTPATIENT PHYSICAL THERAPY LOWER EXTREMITY TREATMENT   Patient Name: Peter Russell MRN: [id number] DOB:November 17, 1945, 78 y.o., male Today's Date: 05/11/2024  END OF SESSION:  PT End of Session - 05/11/24 1301     Visit Number 8    Number of Visits 16    Date for Recertification  06/02/24    Authorization Type MEDICARE/BCBS    Progress Note Due on Visit 10    PT Start Time 1301    PT Stop Time 1346    PT Time Calculation (min) 45 min    Activity Tolerance Patient tolerated treatment well;No increased pain;Patient limited by fatigue    Behavior During Therapy Peter Russell for tasks assessed/performed               Past Medical History:  Diagnosis Date   Arthritis    sternum and knees   CKD (chronic kidney disease) stage 3, GFR 30-59 ml/min (HCC) 2020   Hypertension    Prostate cancer Peter Russell)    Past Surgical History:  Procedure Laterality Date   APPENDECTOMY     cataract surgery Bilateral    06/2021, 08/2021   ESOPHAGEAL MANOMETRY N/A 04/08/2023   Procedure: ESOPHAGEAL MANOMETRY (EM);  Surgeon: Peter Dover, MD;  Location: [facility];  Service: Gastroenterology;  Laterality: N/A;   NASAL SINUS SURGERY     PROSTATE CRYOABLATION     Patient Active Problem List   Diagnosis Date Noted   Primary osteoarthritis of right knee 03/22/2024   Primary osteoarthritis of left knee 03/22/2024   Sensorineural hearing loss, bilateral 08/27/2023   Impacted cerumen of both ears 08/27/2023   Aortic atherosclerosis 08/25/2023   Achalasia 08/25/2023   History of prostate cancer 08/25/2023   Shortness of breath 05/24/2021   PVCs (premature ventricular contractions) 05/24/2021   Atypical chest pain 05/24/2021   Personal history of rheumatoid arthritis 02/15/2021   Malignant neoplasm metastatic to bone (HCC) 02/15/2021   Screen for colon cancer 02/15/2021   Vision impairment 02/15/2021   Dysphagia 02/15/2021   Abnormal abdominal CT scan 02/15/2021   Screening for heart disease 02/02/2020    Vaccine counseling 02/02/2020   Chronic gout without tophus 02/02/2020   Advance directive discussed with patient 02/02/2020   Prostate cancer (HCC) 09/28/2019   Essential hypertension, benign 09/28/2019   Urine frequency 09/28/2019   OSA (obstructive sleep apnea) 09/28/2019   Vaccine refused by patient 09/28/2019   Medicare annual wellness visit, subsequent 09/28/2019    PCP: Peter RAMAN. Tysinger, PA-C  REFERRING PROVIDER: Kay CHRISTELLA Cummins, MD  REFERRING DIAG: M17.11 (ICD-10-CM) - Primary osteoarthritis of right knee M17.12 (ICD-10-CM) - Primary osteoarthritis of left knee  THERAPY DIAG:  Difficulty in walking, not elsewhere classified  Muscle weakness (generalized)  Localized edema  Stiffness of left knee, not elsewhere classified  Stiffness of right knee, not elsewhere classified  Chronic pain of both knees  Rationale for Evaluation and Treatment: Rehabilitation  ONSET DATE: A year ago  SUBJECTIVE:   SUBJECTIVE STATEMENT: Peter Russell notes his 18 steps are getting easier with less stiffness.  His HEP compliance is about 50%.  PERTINENT HISTORY: Bil knee OA, stage 3 CKD, HTN  [name] notes right > left knee pain of at least a year duration.  He is trying Meloxicam .  He notes trouble descending stairs and hills, difficulty squatting.    PAIN:  Are you having pain? Yes: NPRS scale: 0-4/10 this week Pain location: Bilateral medial knee joints Pain description: Ache, stiff and sore Aggravating factors: Prolonged postures and too much  weightbearing Relieving factors: Meloxicam  1 x a day, Change of position and gentle movement  PRECAUTIONS: None  RED FLAGS: None   WEIGHT BEARING RESTRICTIONS: No  FALLS:  Has patient fallen in last 6 months? 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  DIAGNOSTIC FINDINGS: IMPRESSION: 1. Severe degenerative changes of the RIGHT knee medial compartment. 2. Moderate degenerative changes of the LEFT knee medial compartment. 3. Subcortical cyst formation with possible patellar surface irregularity along the medial facet of the LEFT patella likely reflecting underlying cartilage deficiency/injury. 4. Small RIGHT knee joint effusion.  PATIENT SURVEYS:  PSFS: THE PATIENT SPECIFIC FUNCTIONAL SCALE  Place score of 0-10 (0 = unable to perform activity and 10 = able to perform activity at the same level as before injury or problem)  Activity Date: 04/07/2024 05/11/2024   Ascending stairs 6 7   2.  Kneeling 2 2   3.  Descending stairs or a slope like his driveway 4 5   4.      Total Score 4 4.67     Total Score = Sum of activity scores/number of activities  Minimally Detectable Change: 3 points (for single activity); 2 points (for average score)  [facility] (nd). The Patient Specific Functional Scale . Retrieved from [email or web address]   COGNITION: Overall cognitive status: Within functional limits for tasks assessed     SENSATION: Infrequent foot and ankle tingling  EDEMA:  Noted and not objectively assessed   LOWER EXTREMITY ROM:  Active ROM Left/Right 04/07/2024   Hip flexion    Hip extension    Hip abduction    Hip adduction    Hip internal rotation    Hip external rotation    Knee flexion 132/126   Knee extension 3/2   Ankle dorsiflexion    Ankle plantarflexion    Ankle inversion    Ankle eversion     (Blank rows = not tested)  LOWER EXTREMITY STRENGTH:  Assessed in pounds with a hand-held dynamometer Left/Right 04/07/2024 Left/Right  05/04/2024  Hip flexion    Hip extension    Hip abduction    Hip adduction    Hip internal rotation    Hip external rotation    Knee flexion    Knee extension 62.3/38.2 67.8/47.4  Ankle dorsiflexion    Ankle plantarflexion    Ankle inversion    Ankle eversion     (Blank rows = not tested)  GAIT: Distance walked: 100 feet Assistive device utilized: None Level of assistance: Complete Independence Comments: Arsal notes he has difficulty with too much weightbearing or with longer periods of sitting  TREATMENT DATE:  05/11/2024 Recumbent bike Seat 7 for 5 minutes Resistance level 3 Seated straight leg raises 3 sets of 10 for 3 seconds Seated knee extension machine 90-40 degrees (up both, down 1 leg only with slow eccentric contraction) 10# left and 5# right 10 reps each side  Functional Activities: Hip hike in parallel bars (to avoid knee valgus) 3 sets of 10 for 3 seconds (lots of feedback and correction required)  Neuromuscular re-education: Tandem balance on foam eyes open 4 x each foot in front for 20 seconds Single leg balance attempted, unable   05/06/2024 Nu Step Level 6 for 5 minutes Seated straight leg raises 4 sets of 5 for 2 seconds (needed less PT feedback and correction) Seated knee extension machine 90-40 degrees (up both, down 1 leg only with slow eccentric contraction) 10# left and 5# right 10 reps each side  Functional Activities: Double Leg Press: 75# slow eccentric 10 reps Single Leg Press: 50# 10 reps right leg with slow eccentric contraction; 62# left 10 reps with slow eccentric  Neuromuscular re-education: Feet together on foam eyes open; head turning and eyes closed 2 x each 20 seconds   05/04/2024 Nu Step Level 6 for 5 minutes Seated straight leg raises 4 sets of 5 for 2 seconds (lots of PT feedback and correction  required)  97535: Education regarding anatomy (muscle and joint); review of imaging; discussed possible benefits of injections (cortisone vs hyaluronic acid) and recommended he speak with Dr. Jerri to see if he is a candidate; reviewed the importance of long-term consistent HEP compliance to improve quadriceps strength for functional and pain progress   PATIENT EDUCATION:  Education details: See above Person educated: Patient Education method: Explanation, Demonstration, Tactile cues, Verbal cues, and Handouts Education comprehension: verbalized understanding, returned demonstration, verbal cues required, tactile cues required, and needs further education  HOME EXERCISE PROGRAM: Access Code: [email or web address] URL: [email or web address] Date: 05/11/2024 Prepared by: Lamar Ivory  Exercises - Supine Quadricep Sets  - 3 x daily - 7 x weekly - 2 sets - 10 reps - 5 second hold - Small Range Straight Leg Raise  - 3 x daily - 7 x weekly - 2 sets - 5 reps - 3 seconds hold - Sit to Stand Without Arm Support  - 2 x daily - 7 x weekly - 1 sets - 5 reps - Seated Active Straight-Leg Raise  - 1 x daily - 7 x weekly - 2 sets - 5 reps - 3s hold - Standing Hip Hiking  - 1 x daily - 7 x weekly - 3 sets - 10 reps - 3 seconds hold  ASSESSMENT:  CLINICAL IMPRESSION: Kinsler notes stairs are getting easier with less pain and stiffness.  Current HEP compliance is roughly 50% of recommended.  Jerryl will certainly benefit from increasing home exercise program compliance, particularly with his seated straight leg raise exercise to improve quadriceps strength and decreased joint compressive forces.  We had a conversation about this today and Jak appears to be on board with making an effort to increase his home exercise program participation in order to more quickly meet long-term goals.  OBJECTIVE IMPAIRMENTS: Abnormal gait, decreased activity tolerance, decreased endurance, decreased knowledge of  condition, difficulty walking, decreased ROM, decreased strength, increased edema, impaired perceived functional ability, and pain.   ACTIVITY LIMITATIONS: bending, sitting, standing, squatting, stairs, and locomotion level  PARTICIPATION LIMITATIONS: community activity and occupation  PERSONAL FACTORS: Bil knee OA, stage 3 CKD, HTN are also affecting patient's functional  outcome.   REHAB POTENTIAL: Good  CLINICAL DECISION MAKING: Evolving/moderate complexity  EVALUATION COMPLEXITY: Moderate   GOALS: Goals reviewed with patient? Yes  SHORT TERM GOALS: Target date: 05/05/2024 Deven will be independent with his D1 home exercise program Baseline: Started 04/07/2024 Goal status: On Going 05/11/2024  2.  Improve bilateral quadriceps strength to 75/50 pounds or better Baseline: 62.3/38.2 pounds respectively Goal status: On Going 05/04/2024   LONG TERM GOALS: Target date: 06/02/2024  Improve patient specific functional scale to at least 6 Baseline: 4 Goal status: Ongoing 05/11/2024  2.  Cornellius will report bilateral knee pain consistently 2-3/10 on the numeric pain rating scale Baseline: 2-5/10 Goal status: Ongoing 05/11/2024  3.  Improve bilateral quadriceps strength to 80 pounds or greater Baseline: 62.3/38.2 pounds Goal status: INITIAL  4.  Bralen will be independent with his long-term maintenance home exercise program at discharge Baseline: Started 04/07/2024 Goal status: INITIAL  PLAN:  PT FREQUENCY: 2x/week  PT DURATION: 8 weeks  PLANNED INTERVENTIONS: 97750- Physical Performance Testing, 97110-Therapeutic exercises, 97530- Therapeutic activity, 97112- Neuromuscular re-education, 97535- Self Care, 02859- Manual therapy, [id number]- Gait training, [phone]- Electrical stimulation (unattended), 97016- Vasopneumatic device, Patient/Family education, Balance training, Stair training, and Cryotherapy  PLAN FOR NEXT SESSION: Emphasis on quadriceps strengthening and increasing home  exercise program compliance.   Myer LELON Ivory PT, MPT 05/11/24 2:44 PM

## 2024-05-13 ENCOUNTER — Encounter: Payer: Self-pay | Admitting: Rehabilitative and Restorative Service Providers"

## 2024-05-13 ENCOUNTER — Ambulatory Visit (INDEPENDENT_AMBULATORY_CARE_PROVIDER_SITE_OTHER): Admitting: Rehabilitative and Restorative Service Providers"

## 2024-05-13 DIAGNOSIS — R600 Localized edema: Secondary | ICD-10-CM | POA: Diagnosis not present

## 2024-05-13 DIAGNOSIS — M6281 Muscle weakness (generalized): Secondary | ICD-10-CM

## 2024-05-13 DIAGNOSIS — M25662 Stiffness of left knee, not elsewhere classified: Secondary | ICD-10-CM | POA: Diagnosis not present

## 2024-05-13 DIAGNOSIS — G8929 Other chronic pain: Secondary | ICD-10-CM

## 2024-05-13 DIAGNOSIS — M25661 Stiffness of right knee, not elsewhere classified: Secondary | ICD-10-CM

## 2024-05-13 DIAGNOSIS — M25562 Pain in left knee: Secondary | ICD-10-CM

## 2024-05-13 DIAGNOSIS — R262 Difficulty in walking, not elsewhere classified: Secondary | ICD-10-CM

## 2024-05-13 DIAGNOSIS — M25561 Pain in right knee: Secondary | ICD-10-CM

## 2024-05-13 NOTE — Therapy (Signed)
 OUTPATIENT PHYSICAL THERAPY LOWER EXTREMITY TREATMENT   Patient Name: Peter Russell MRN: [id number] DOB:1945-08-29, 78 y.o., male Today's Date: 05/13/2024  END OF SESSION:  PT End of Session - 05/13/24 1302     Visit Number 9    Number of Visits 16    Date for Recertification  06/02/24    Authorization Type MEDICARE/BCBS    Progress Note Due on Visit 10    PT Start Time 1301    PT Stop Time 1345    PT Time Calculation (min) 44 min    Activity Tolerance Patient tolerated treatment well;No increased pain;Patient limited by fatigue    Behavior During Therapy [facility] for tasks assessed/performed           Past Medical History:  Diagnosis Date   Arthritis    sternum and knees   CKD (chronic kidney disease) stage 3, GFR 30-59 ml/min (HCC) 2020   Hypertension    Prostate cancer [facility])    Past Surgical History:  Procedure Laterality Date   APPENDECTOMY     cataract surgery Bilateral    06/2021, 08/2021   ESOPHAGEAL MANOMETRY N/A 04/08/2023   Procedure: ESOPHAGEAL MANOMETRY (EM);  Surgeon: Rollin Dover, MD;  Location: [facility];  Service: Gastroenterology;  Laterality: N/A;   NASAL SINUS SURGERY     PROSTATE CRYOABLATION     Patient Active Problem List   Diagnosis Date Noted   Primary osteoarthritis of right knee 03/22/2024   Primary osteoarthritis of left knee 03/22/2024   Sensorineural hearing loss, bilateral 08/27/2023   Impacted cerumen of both ears 08/27/2023   Aortic atherosclerosis 08/25/2023   Achalasia 08/25/2023   History of prostate cancer 08/25/2023   Shortness of breath 05/24/2021   PVCs (premature ventricular contractions) 05/24/2021   Atypical chest pain 05/24/2021   Personal history of rheumatoid arthritis 02/15/2021   Malignant neoplasm metastatic to bone (HCC) 02/15/2021   Screen for colon cancer 02/15/2021   Vision impairment 02/15/2021   Dysphagia 02/15/2021   Abnormal abdominal CT scan 02/15/2021   Screening for heart disease 02/02/2020   Vaccine  counseling 02/02/2020   Chronic gout without tophus 02/02/2020   Advance directive discussed with patient 02/02/2020   Prostate cancer (HCC) 09/28/2019   Essential hypertension, benign 09/28/2019   Urine frequency 09/28/2019   OSA (obstructive sleep apnea) 09/28/2019   Vaccine refused by patient 09/28/2019   Medicare annual wellness visit, subsequent 09/28/2019    PCP: Alm RAMAN. Tysinger, PA-C  REFERRING PROVIDER: Kay CHRISTELLA Cummins, MD  REFERRING DIAG: M17.11 (ICD-10-CM) - Primary osteoarthritis of right knee M17.12 (ICD-10-CM) - Primary osteoarthritis of left knee  THERAPY DIAG:  Difficulty in walking, not elsewhere classified  Muscle weakness (generalized)  Localized edema  Stiffness of left knee, not elsewhere classified  Stiffness of right knee, not elsewhere classified  Chronic pain of both knees  Rationale for Evaluation and Treatment: Rehabilitation  ONSET DATE: A year ago  SUBJECTIVE:   SUBJECTIVE STATEMENT: Peter Russell notes his 18 steps continue to get easier with less stiffness.  His HEP compliance is improving.  He also notes stiffness with driving.  PERTINENT HISTORY: Bil knee OA, stage 3 CKD, HTN  Peter Russell notes right > left knee pain of at least a year duration.  He is trying Meloxicam .  He notes trouble descending stairs and hills, difficulty squatting.    PAIN:  Are you having pain? Yes: NPRS scale: 0-4/10 this week Pain location: Bilateral medial knee joints Pain description: Ache, stiff and sore Aggravating factors: Prolonged postures  and too much weightbearing Relieving factors: Meloxicam  1 x a day, Change of position and gentle movement  PRECAUTIONS: None  RED FLAGS: None   WEIGHT BEARING RESTRICTIONS: No  FALLS:  Has patient fallen in last 6 months? 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  DIAGNOSTIC FINDINGS: IMPRESSION: 1. Severe degenerative changes of the RIGHT knee medial compartment. 2. Moderate degenerative changes of the LEFT knee medial compartment. 3. Subcortical cyst formation with possible patellar surface irregularity along the medial facet of the LEFT patella likely reflecting underlying cartilage deficiency/injury. 4. Small RIGHT knee joint effusion.  PATIENT SURVEYS:  PSFS: THE PATIENT SPECIFIC FUNCTIONAL SCALE  Place score of 0-10 (0 = unable to perform activity and 10 = able to perform activity at the same level as before injury or problem)  Activity Date: 04/07/2024 05/11/2024   Ascending stairs 6 7   2.  Kneeling 2 2   3.  Descending stairs or a slope like his driveway 4 5   4.      Total Score 4 4.67     Total Score = Sum of activity scores/number of activities  Minimally Detectable Change: 3 points (for single activity); 2 points (for average score)  [facility] (nd). The Patient Specific Functional Scale . Retrieved from [email or web address]   COGNITION: Overall cognitive status: Within functional limits for tasks assessed     SENSATION: Infrequent foot and ankle tingling  EDEMA:  Noted and not objectively assessed   LOWER EXTREMITY ROM:  Active ROM Left/Right 04/07/2024   Hip flexion    Hip extension    Hip abduction    Hip adduction    Hip internal rotation    Hip external rotation    Knee flexion 132/126   Knee extension 3/2   Ankle dorsiflexion    Ankle plantarflexion    Ankle inversion    Ankle eversion     (Blank rows = not tested)  LOWER EXTREMITY STRENGTH:  Assessed in pounds with a hand-held dynamometer  Left/Right 04/07/2024 Left/Right 05/04/2024  Hip flexion    Hip extension    Hip abduction    Hip adduction    Hip internal rotation    Hip external rotation    Knee flexion    Knee extension 62.3/38.2 67.8/47.4  Ankle dorsiflexion    Ankle plantarflexion    Ankle inversion    Ankle eversion     (Blank rows = not tested)  GAIT: Distance walked: 100 feet Assistive device utilized: None Level of assistance: Complete Independence Comments: Peter Russell notes he has difficulty with too much weightbearing or with longer periods of sitting  TREATMENT DATE:  05/13/2024 Recumbent bike Seat 7 for 4 minutes Resistance level 4, Goal of 8 MPH (NuStep next time) Seated straight leg raises 2 sets of 10 for 3 seconds Seated knee extension machine 90-40 degrees (up both, down 1 leg only with slow eccentric contraction) 15# left and 10# right 15 reps each side (needed a rest after 10 reps on the right)  Functional Activities: Hip hike in parallel bars (to avoid knee valgus) 2 sets of 10 for 3 seconds (lots of feedback and correction required) Hip hike in door frame 3 x on each side (to avoid lateral lean)  Neuromuscular re-education: Tandem balance on foam eyes open 4 x each foot in front for 20 seconds Single leg balance dynamic, slow marching 20 x   05/11/2024 Recumbent bike Seat 7 for 5 minutes Resistance level 3 Seated straight leg raises 3 sets of 10 for 3 seconds Seated knee extension machine 90-40 degrees (up both, down 1 leg only with slow eccentric contraction) 10# left and 5# right 10 reps each side  Functional Activities: Hip hike in parallel bars (to avoid knee valgus) 3 sets of 10 for 3 seconds (lots of feedback and correction required)  Neuromuscular re-education: Tandem balance on foam eyes open 4 x each foot in front for 20 seconds Single leg balance attempted,  unable   05/06/2024 Nu Step Level 6 for 5 minutes Seated straight leg raises 4 sets of 5 for 2 seconds (needed less PT feedback and correction) Seated knee extension machine 90-40 degrees (up both, down 1 leg only with slow eccentric contraction) 10# left and 5# right 10 reps each side  Functional Activities: Double Leg Press: 75# slow eccentric 10 reps Single Leg Press: 50# 10 reps right leg with slow eccentric contraction; 62# left 10 reps with slow eccentric  Neuromuscular re-education: Feet together on foam eyes open; head turning and eyes closed 2 x each 20 seconds   PATIENT EDUCATION:  Education details: See above Person educated: Patient Education method: Explanation, Demonstration, Tactile cues, Verbal cues, and Handouts Education comprehension: verbalized understanding, returned demonstration, verbal cues required, tactile cues required, and needs further education  HOME EXERCISE PROGRAM: Access Code: [email or web address] URL: [email or web address] Date: 05/11/2024 Prepared by: Lamar Ivory  Exercises - Supine Quadricep Sets  - 3 x daily - 7 x weekly - 2 sets - 10 reps - 5 second hold - Small Range Straight Leg Raise  - 3 x daily - 7 x weekly - 2 sets - 5 reps - 3 seconds hold - Sit to Stand Without Arm Support  - 2 x daily - 7 x weekly - 1 sets - 5 reps - Seated Active Straight-Leg Raise  - 1 x daily - 7 x weekly - 2 sets - 5 reps - 3s hold - Standing Hip Hiking  - 1 x daily - 7 x weekly - 3 sets - 10 reps - 3 seconds hold  ASSESSMENT:  CLINICAL IMPRESSION: Peter Russell notes improved HEP compliance this week.  He will continue to benefit from consistent home exercise program compliance, particularly with his seated straight leg raise exercise to improve quadriceps strength and decreased joint compressive forces.  Hip hike looked better in the doorframe as this keeps him from leaning to the side.  His gait has lateral lean due to hip abductors weakness and this activity  should help reduce this gait deformity.  OBJECTIVE IMPAIRMENTS: Abnormal gait, decreased activity tolerance, decreased endurance, decreased knowledge of condition, difficulty walking, decreased ROM, decreased strength,  increased edema, impaired perceived functional ability, and pain.   ACTIVITY LIMITATIONS: bending, sitting, standing, squatting, stairs, and locomotion level  PARTICIPATION LIMITATIONS: community activity and occupation  PERSONAL FACTORS: Bil knee OA, stage 3 CKD, HTN are also affecting patient's functional outcome.   REHAB POTENTIAL: Good  CLINICAL DECISION MAKING: Evolving/moderate complexity  EVALUATION COMPLEXITY: Moderate   GOALS: Goals reviewed with patient? Yes  SHORT TERM GOALS: Target date: 05/05/2024 Peter Russell will be independent with his D1 home exercise program Baseline: Started 04/07/2024 Goal status: Met 05/13/2024  2.  Improve bilateral quadriceps strength to 75/50 pounds or better Baseline: 62.3/38.2 pounds respectively Goal status: On Going 05/04/2024   LONG TERM GOALS: Target date: 06/02/2024  Improve patient specific functional scale to at least 6 Baseline: 4 Goal status: Ongoing 05/11/2024  2.  Peter Russell will report bilateral knee pain consistently 2-3/10 on the numeric pain rating scale Baseline: 2-5/10 Goal status: Ongoing 05/11/2024  3.  Improve bilateral quadriceps strength to 80 pounds or greater Baseline: 62.3/38.2 pounds Goal status: INITIAL  4.  Peter Russell will be independent with his long-term maintenance home exercise program at discharge Baseline: Started 04/07/2024 Goal status: INITIAL  PLAN:  PT FREQUENCY: 2x/week  PT DURATION: 8 weeks  PLANNED INTERVENTIONS: 97750- Physical Performance Testing, 97110-Therapeutic exercises, 97530- Therapeutic activity, 97112- Neuromuscular re-education, 97535- Self Care, 02859- Manual therapy, [phone]- Gait training, [phone]- Electrical stimulation (unattended), 97016- Vasopneumatic device,  Patient/Family education, Balance training, Stair training, and Cryotherapy  PLAN FOR NEXT SESSION: Progress note, quad strength, hip abductors strength and balance progressions   Peter Russell LELON Ivory PT, MPT 05/13/24 1:54 PM

## 2024-05-16 NOTE — Therapy (Signed)
 OUTPATIENT PHYSICAL THERAPY LOWER EXTREMITY TREATMENT   Patient Name: Peter Russell MRN: [id number] DOB:Jan 22, 1946, 78 y.o., male Today's Date: 05/16/2024  END OF SESSION:     Past Medical History:  Diagnosis Date   Arthritis    sternum and knees   CKD (chronic kidney disease) stage 3, GFR 30-59 ml/min (HCC) 2020   Hypertension    Prostate cancer [facility])    Past Surgical History:  Procedure Laterality Date   APPENDECTOMY     cataract surgery Bilateral    06/2021, 08/2021   ESOPHAGEAL MANOMETRY N/A 04/08/2023   Procedure: ESOPHAGEAL MANOMETRY (EM);  Surgeon: Peter Dover, MD;  Location: [facility];  Service: Gastroenterology;  Laterality: N/A;   NASAL SINUS SURGERY     PROSTATE CRYOABLATION     Patient Active Problem List   Diagnosis Date Noted   Primary osteoarthritis of right knee 03/22/2024   Primary osteoarthritis of left knee 03/22/2024   Sensorineural hearing loss, bilateral 08/27/2023   Impacted cerumen of both ears 08/27/2023   Aortic atherosclerosis 08/25/2023   Achalasia 08/25/2023   History of prostate cancer 08/25/2023   Shortness of breath 05/24/2021   PVCs (premature ventricular contractions) 05/24/2021   Atypical chest pain 05/24/2021   Personal history of rheumatoid arthritis 02/15/2021   Malignant neoplasm metastatic to bone (HCC) 02/15/2021   Screen for colon cancer 02/15/2021   Vision impairment 02/15/2021   Dysphagia 02/15/2021   Abnormal abdominal CT scan 02/15/2021   Screening for heart disease 02/02/2020   Vaccine counseling 02/02/2020   Chronic gout without tophus 02/02/2020   Advance directive discussed with patient 02/02/2020   Prostate cancer (HCC) 09/28/2019   Essential hypertension, benign 09/28/2019   Urine frequency 09/28/2019   OSA (obstructive sleep apnea) 09/28/2019   Vaccine refused by patient 09/28/2019   Medicare annual wellness visit, subsequent 09/28/2019    PCP: Peter RAMAN. Tysinger, PA-C  REFERRING PROVIDER: Kay CHRISTELLA Cummins, MD  REFERRING DIAG: M17.11 (ICD-10-CM) - Primary osteoarthritis of right knee M17.12 (ICD-10-CM) - Primary osteoarthritis of left knee  THERAPY DIAG:  No diagnosis found.  Rationale for Evaluation and Treatment: Rehabilitation  ONSET DATE: A year ago  SUBJECTIVE:   SUBJECTIVE STATEMENT: *** Peter Russell notes his 18 steps continue to get easier with less stiffness.  His HEP compliance is improving.  He also notes stiffness with driving.  PERTINENT HISTORY: Bil knee OA, stage 3 CKD, HTN  Peter Russell notes right > left knee pain of at least a year duration.  He is trying Meloxicam .  He notes trouble descending stairs and hills, difficulty squatting.    PAIN:  Are you having pain? Yes: NPRS scale: 0-4/10 this week Pain location: Bilateral medial knee joints Pain description: Ache, stiff and sore Aggravating factors: Prolonged postures and too much weightbearing Relieving factors: Meloxicam  1 x a day, Change of position and gentle movement  PRECAUTIONS: None  RED FLAGS: None   WEIGHT BEARING RESTRICTIONS: No  FALLS:  Has patient fallen in last 6 months? 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  DIAGNOSTIC FINDINGS: IMPRESSION: 1. Severe degenerative changes of the RIGHT knee medial compartment. 2.  Moderate degenerative changes of the LEFT knee medial compartment. 3. Subcortical cyst formation with possible patellar surface irregularity along the medial facet of the LEFT patella likely reflecting underlying cartilage deficiency/injury. 4. Small RIGHT knee joint effusion.  PATIENT  SURVEYS:  PSFS: THE PATIENT SPECIFIC FUNCTIONAL SCALE  Place score of 0-10 (0 = unable to perform activity and 10 = able to perform activity at the same level as before injury or problem)  Activity Date: 04/07/2024 05/11/2024   Ascending stairs 6 7   2.  Kneeling 2 2   3.  Descending stairs or a slope like his driveway 4 5   4.      Total Score 4 4.67     Total Score = Sum of activity scores/number of activities  Minimally Detectable Change: 3 points (for single activity); 2 points (for average score)  [facility] (nd). The Patient Specific Functional Scale . Retrieved from [email or web address]   COGNITION: Overall cognitive status: Within functional limits for tasks assessed     SENSATION: Infrequent foot and ankle tingling  EDEMA:  Noted and not objectively assessed   LOWER EXTREMITY ROM:  Active ROM Left/Right 04/07/2024   Hip flexion    Hip extension    Hip abduction    Hip adduction    Hip internal rotation    Hip external rotation    Knee flexion 132/126   Knee extension 3/2   Ankle dorsiflexion    Ankle plantarflexion    Ankle inversion    Ankle eversion     (Blank rows = not tested)  LOWER EXTREMITY STRENGTH:  Assessed in pounds with a hand-held dynamometer Left/Right 04/07/2024 Left/Right 05/04/2024  Hip flexion    Hip extension    Hip abduction    Hip adduction    Hip internal rotation    Hip external rotation    Knee flexion    Knee extension 62.3/38.2 67.8/47.4  Ankle dorsiflexion    Ankle plantarflexion    Ankle inversion    Ankle eversion     (Blank rows = not tested)  GAIT: Distance walked: 100 feet Assistive device utilized: None Level of assistance: Complete Independence Comments: Peter Russell notes he has difficulty with too much weightbearing or with longer periods of sitting                                                                                                                         TREATMENT DATE:  05/17/2024 ***   05/13/2024 Recumbent bike Seat 7 for 4 minutes Resistance level 4, Goal of 8 MPH (NuStep next time) Seated straight leg raises 2 sets of 10 for 3 seconds Seated knee extension machine 90-40 degrees (up both, down 1 leg only with slow eccentric contraction) 15# left and 10# right 15 reps each side (needed a rest after 10 reps on the right)  Functional Activities: Hip hike in parallel bars (to avoid knee valgus) 2 sets of 10 for 3 seconds (lots of feedback and  correction required) Hip hike in door frame 3 x on each side (to avoid lateral lean)  Neuromuscular re-education: Tandem balance on foam eyes open 4 x each foot in front for 20 seconds Single leg balance dynamic, slow marching 20 x   05/11/2024 Recumbent bike Seat 7 for 5 minutes Resistance level 3 Seated straight leg raises 3 sets of 10 for 3 seconds Seated knee extension machine 90-40 degrees (up both, down 1 leg only with slow eccentric contraction) 10# left and 5# right 10 reps each side  Functional Activities: Hip hike in parallel bars (to avoid knee valgus) 3 sets of 10 for 3 seconds (lots of feedback and correction required)  Neuromuscular re-education: Tandem balance on foam eyes open 4 x each foot in front for 20 seconds Single leg balance attempted, unable   05/06/2024 Nu Step Level 6 for 5 minutes Seated straight leg raises 4 sets of 5 for 2 seconds (needed less PT feedback and correction) Seated knee extension machine 90-40 degrees (up both, down 1 leg only with slow eccentric contraction) 10# left and 5# right 10 reps each side  Functional Activities: Double Leg Press: 75# slow eccentric 10 reps Single Leg Press: 50# 10 reps right leg with slow eccentric contraction; 62# left 10 reps with slow eccentric  Neuromuscular re-education: Feet together on foam eyes open; head turning and eyes closed 2 x each 20 seconds   PATIENT EDUCATION:  Education details: See  above Person educated: Patient Education method: Explanation, Demonstration, Tactile cues, Verbal cues, and Handouts Education comprehension: verbalized understanding, returned demonstration, verbal cues required, tactile cues required, and needs further education  HOME EXERCISE PROGRAM: Access Code: [email or web address] URL: [email or web address] Date: 05/11/2024 Prepared by: Lamar Ivory  Exercises - Supine Quadricep Sets  - 3 x daily - 7 x weekly - 2 sets - 10 reps - 5 second hold - Small Range Straight Leg Raise  - 3 x daily - 7 x weekly - 2 sets - 5 reps - 3 seconds hold - Sit to Stand Without Arm Support  - 2 x daily - 7 x weekly - 1 sets - 5 reps - Seated Active Straight-Leg Raise  - 1 x daily - 7 x weekly - 2 sets - 5 reps - 3s hold - Standing Hip Hiking  - 1 x daily - 7 x weekly - 3 sets - 10 reps - 3 seconds hold  ASSESSMENT:  CLINICAL IMPRESSION: PIERRETTE Standing notes improved HEP compliance this week.  He will continue to benefit from consistent home exercise program compliance, particularly with his seated straight leg raise exercise to improve quadriceps strength and decreased joint compressive forces.  Hip hike looked better in the doorframe as this keeps him from leaning to the side.  His gait has lateral lean due to hip abductors weakness and this activity should help reduce this gait deformity.  OBJECTIVE IMPAIRMENTS: Abnormal gait, decreased activity tolerance, decreased endurance, decreased knowledge of condition, difficulty walking, decreased ROM, decreased strength, increased edema, impaired perceived functional ability, and pain.   ACTIVITY LIMITATIONS: bending, sitting, standing, squatting, stairs, and locomotion level  PARTICIPATION LIMITATIONS: community activity and occupation  PERSONAL FACTORS: Bil knee OA, stage 3 CKD, HTN are also affecting patient's functional outcome.   REHAB POTENTIAL: Good  CLINICAL DECISION MAKING: Evolving/moderate  complexity  EVALUATION COMPLEXITY: Moderate   GOALS: Goals reviewed with patient? Yes  SHORT TERM GOALS: Target date: 05/05/2024 Alexa will be independent with his D1 home exercise program Baseline: Started 04/07/2024 Goal status:  Met 05/13/2024  2.  Improve bilateral quadriceps strength to 75/50 pounds or better Baseline: 62.3/38.2 pounds respectively Goal status: On Going 05/04/2024   LONG TERM GOALS: Target date: 06/02/2024  Improve patient specific functional scale to at least 6 Baseline: 4 Goal status: Ongoing 05/11/2024  2.  Roc will report bilateral knee pain consistently 2-3/10 on the numeric pain rating scale Baseline: 2-5/10 Goal status: Ongoing 05/11/2024  3.  Improve bilateral quadriceps strength to 80 pounds or greater Baseline: 62.3/38.2 pounds Goal status: INITIAL  4.  Darnel will be independent with his long-term maintenance home exercise program at discharge Baseline: Started 04/07/2024 Goal status: INITIAL  PLAN:  PT FREQUENCY: 2x/week  PT DURATION: 8 weeks  PLANNED INTERVENTIONS: 97750- Physical Performance Testing, 97110-Therapeutic exercises, 97530- Therapeutic activity, 97112- Neuromuscular re-education, 97535- Self Care, 02859- Manual therapy, [phone]- Gait training, [phone]- Electrical stimulation (unattended), 97016- Vasopneumatic device, Patient/Family education, Balance training, Stair training, and Cryotherapy  PLAN FOR NEXT SESSION: *** Progress note, quad strength, hip abductors strength and balance progressions   Susannah Daring, PT, DPT 05/16/24 10:04 AM

## 2024-05-17 ENCOUNTER — Ambulatory Visit (INDEPENDENT_AMBULATORY_CARE_PROVIDER_SITE_OTHER)

## 2024-05-17 DIAGNOSIS — M25561 Pain in right knee: Secondary | ICD-10-CM

## 2024-05-17 DIAGNOSIS — M6281 Muscle weakness (generalized): Secondary | ICD-10-CM

## 2024-05-17 DIAGNOSIS — G8929 Other chronic pain: Secondary | ICD-10-CM

## 2024-05-17 DIAGNOSIS — M25661 Stiffness of right knee, not elsewhere classified: Secondary | ICD-10-CM

## 2024-05-17 DIAGNOSIS — R262 Difficulty in walking, not elsewhere classified: Secondary | ICD-10-CM

## 2024-05-17 DIAGNOSIS — M25662 Stiffness of left knee, not elsewhere classified: Secondary | ICD-10-CM

## 2024-05-17 DIAGNOSIS — R600 Localized edema: Secondary | ICD-10-CM

## 2024-05-17 DIAGNOSIS — M25562 Pain in left knee: Secondary | ICD-10-CM

## 2024-05-19 ENCOUNTER — Ambulatory Visit (INDEPENDENT_AMBULATORY_CARE_PROVIDER_SITE_OTHER): Admitting: Rehabilitative and Restorative Service Providers"

## 2024-05-19 ENCOUNTER — Encounter: Payer: Self-pay | Admitting: Rehabilitative and Restorative Service Providers"

## 2024-05-19 DIAGNOSIS — M25562 Pain in left knee: Secondary | ICD-10-CM

## 2024-05-19 DIAGNOSIS — R600 Localized edema: Secondary | ICD-10-CM | POA: Diagnosis not present

## 2024-05-19 DIAGNOSIS — M25661 Stiffness of right knee, not elsewhere classified: Secondary | ICD-10-CM

## 2024-05-19 DIAGNOSIS — R262 Difficulty in walking, not elsewhere classified: Secondary | ICD-10-CM

## 2024-05-19 DIAGNOSIS — G8929 Other chronic pain: Secondary | ICD-10-CM

## 2024-05-19 DIAGNOSIS — M25662 Stiffness of left knee, not elsewhere classified: Secondary | ICD-10-CM

## 2024-05-19 DIAGNOSIS — M25561 Pain in right knee: Secondary | ICD-10-CM

## 2024-05-19 DIAGNOSIS — M6281 Muscle weakness (generalized): Secondary | ICD-10-CM | POA: Diagnosis not present

## 2024-05-19 NOTE — Therapy (Signed)
 OUTPATIENT PHYSICAL THERAPY LOWER EXTREMITY TREATMENT NOTE   Patient Name: Peter Russell MRN: [id number] DOB:July 15, 1946, 78 y.o., male Today's Date: 05/19/2024   END OF SESSION:  PT End of Session - 05/19/24 1348     Visit Number 11    Number of Visits 16    Date for Recertification  06/02/24    Authorization Type MEDICARE/BCBS    Progress Note Due on Visit 10    PT Start Time 1347    PT Stop Time 1430    PT Time Calculation (min) 43 min    Activity Tolerance Patient tolerated treatment well;No increased pain;Patient limited by fatigue    Behavior During Therapy [facility] for tasks assessed/performed             Past Medical History:  Diagnosis Date   Arthritis    sternum and knees   CKD (chronic kidney disease) stage 3, GFR 30-59 ml/min (HCC) 2020   Hypertension    Prostate cancer [facility])    Past Surgical History:  Procedure Laterality Date   APPENDECTOMY     cataract surgery Bilateral    06/2021, 08/2021   ESOPHAGEAL MANOMETRY N/A 04/08/2023   Procedure: ESOPHAGEAL MANOMETRY (EM);  Surgeon: Rollin Dover, MD;  Location: [facility];  Service: Gastroenterology;  Laterality: N/A;   NASAL SINUS SURGERY     PROSTATE CRYOABLATION     Patient Active Problem List   Diagnosis Date Noted   Primary osteoarthritis of right knee 03/22/2024   Primary osteoarthritis of left knee 03/22/2024   Sensorineural hearing loss, bilateral 08/27/2023   Impacted cerumen of both ears 08/27/2023   Aortic atherosclerosis 08/25/2023   Achalasia 08/25/2023   History of prostate cancer 08/25/2023   Shortness of breath 05/24/2021   PVCs (premature ventricular contractions) 05/24/2021   Atypical chest pain 05/24/2021   Personal history of rheumatoid arthritis 02/15/2021   Malignant neoplasm metastatic to bone (HCC) 02/15/2021   Screen for colon cancer 02/15/2021   Vision impairment 02/15/2021   Dysphagia 02/15/2021   Abnormal abdominal CT scan 02/15/2021   Screening for heart disease  02/02/2020   Vaccine counseling 02/02/2020   Chronic gout without tophus 02/02/2020   Advance directive discussed with patient 02/02/2020   Prostate cancer (HCC) 09/28/2019   Essential hypertension, benign 09/28/2019   Urine frequency 09/28/2019   OSA (obstructive sleep apnea) 09/28/2019   Vaccine refused by patient 09/28/2019   Medicare annual wellness visit, subsequent 09/28/2019    PCP: Peter RAMAN. Tysinger, PA-C  REFERRING PROVIDER: Kay CHRISTELLA Cummins, MD  REFERRING DIAG: M17.11 (ICD-10-CM) - Primary osteoarthritis of right knee M17.12 (ICD-10-CM) - Primary osteoarthritis of left knee  THERAPY DIAG:  Difficulty in walking, not elsewhere classified  Muscle weakness (generalized)  Localized edema  Stiffness of left knee, not elsewhere classified  Stiffness of right knee, not elsewhere classified  Chronic pain of both knees  Rationale for Evaluation and Treatment: Rehabilitation  ONSET DATE: A year ago  SUBJECTIVE:   SUBJECTIVE STATEMENT: Peter Russell notes he was able to descend the stairs after PT 2 days ago with little discomfort.  Ascending stairs and inclines are limiting.  PERTINENT HISTORY: Bil knee OA, stage 3 CKD, HTN  Peter Russell notes right > left knee pain of at least a year duration.  He is trying Meloxicam .  He notes trouble descending stairs and hills, difficulty squatting.    PAIN:  Are you having pain? Yes: NPRS scale: 0-3/10 this week Pain location: Bilateral medial knee joints Pain description: Ache, stiff and sore Aggravating  factors: Sloped driveway and ascending stairs Relieving factors: Meloxicam  (none in 6 days), Change of position and gentle movement  PRECAUTIONS: None  RED FLAGS: None   WEIGHT BEARING RESTRICTIONS: No  FALLS:  Has patient fallen in last 6 months? 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  DIAGNOSTIC FINDINGS: IMPRESSION: 1. Severe degenerative changes of the RIGHT knee medial compartment. 2. Moderate degenerative changes of the LEFT knee medial compartment. 3. Subcortical cyst formation with possible patellar surface irregularity along the medial facet of the LEFT patella likely reflecting underlying cartilage deficiency/injury. 4. Small RIGHT knee joint effusion.  PATIENT SURVEYS:  PSFS: THE PATIENT SPECIFIC FUNCTIONAL SCALE  Place score of 0-10 (0 = unable to perform activity and 10 = able to perform activity at the same level as before injury or problem)  Activity Date: 04/07/2024 05/11/2024   Ascending stairs 6 7   2.  Kneeling 2 2   3.  Descending stairs or a slope like his driveway 4 5   4.      Total Score 4 4.67     Total Score = Sum of activity scores/number of activities  Minimally Detectable Change: 3 points (for single activity); 2 points (for average score)  [facility] (nd). The Patient Specific Functional Scale . Retrieved from [email or web address]   COGNITION: Overall cognitive status: Within functional limits for tasks assessed     SENSATION: Infrequent foot and ankle tingling  EDEMA:  Noted and not objectively assessed   LOWER EXTREMITY ROM:  Active ROM Left/Right 04/07/2024   Hip flexion    Hip extension    Hip abduction    Hip adduction    Hip internal rotation    Hip external rotation    Knee flexion 132/126   Knee extension 3/2   Ankle dorsiflexion    Ankle plantarflexion    Ankle inversion    Ankle eversion     (Blank rows = not tested)  LOWER EXTREMITY STRENGTH:  Assessed in pounds with a hand-held  dynamometer Left/Right 04/07/2024 Left/Right 05/04/2024 Left/Right 05/19/2024  Hip flexion     Hip extension     Hip abduction     Hip adduction     Hip internal rotation     Hip external rotation     Knee flexion     Knee extension 62.3/38.2 67.8/47.4 70.5/51.2  Ankle dorsiflexion     Ankle plantarflexion     Ankle inversion     Ankle eversion      (Blank rows = not tested)  GAIT: Distance walked: 100 feet Assistive device utilized: None Level of assistance: Complete Independence Comments: Krystian notes he has difficulty with too much weightbearing or with longer periods of sitting  TREATMENT DATE:  05/19/2024 NuStep Level 5, Legs Only 5 minutes Seated straight leg raises 2 sets of 10 for 3 seconds Seated knee extension machine 90-40 degrees (up both, down 1 leg only with slow eccentric contraction) 15# left and 10# right 15 reps each side (again, needed a rest after 10 reps on the right)  Functional Activities: Hip hike in parallel bars (to avoid knee valgus) 2 sets of 10 for 3 seconds (lots of feedback and correction required)  Neuromuscular re-education: Tandem balance on foam eyes open 4 x each foot in front for 20 seconds Dynamic heel to toe walk on foam, forward and back 4 laps Single leg balance dynamic, slow marching 10 x   05/17/2024 TherAct: Step ups with 4 step, low level of difficulty Step ups with 6 step, 2x20  Step downs with 4 step, 2x10 each LE with intermittent use of UE (Rt>Lt) secondary to balance and weakness Bilat leg press 1x12 with 75#  Seated LAQ with 4# ankle weight with focus on eccentric movement, 2x10 each side  Patient demonstrated hip hike in doorway with PT providing verbal cues for pelvic movement instead of knee flexion ; patient performed and described activity in obliques, not glutes; would benefit from performing in  parallel bars with mirror for visual feedback  Neuro Re-Ed:  Tandem stance on foam 2x30s each LE  Step over and back with large hurdle 1x8 each LE with high level of difficult  Step over and back with small hurdle 1x8 each LE with improved performance  PT discussed balance strategies and balance systems, especially with compliant vs firm surfaces    05/13/2024 Recumbent bike Seat 7 for 4 minutes Resistance level 4, Goal of 8 MPH (NuStep next time) Seated straight leg raises 2 sets of 10 for 3 seconds Seated knee extension machine 90-40 degrees (up both, down 1 leg only with slow eccentric contraction) 15# left and 10# right 15 reps each side (needed a rest after 10 reps on the right)  Functional Activities: Hip hike in parallel bars (to avoid knee valgus) 2 sets of 10 for 3 seconds (lots of feedback and correction required) Hip hike in door frame 3 x on each side (to avoid lateral lean)  Neuromuscular re-education: Tandem balance on foam eyes open 4 x each foot in front for 20 seconds Single leg balance dynamic, slow marching 20 x  PATIENT EDUCATION:  Education details: See above Person educated: Patient Education method: Explanation, Demonstration, Tactile cues, Verbal cues, and Handouts Education comprehension: verbalized understanding, returned demonstration, verbal cues required, tactile cues required, and needs further education  HOME EXERCISE PROGRAM: Access Code: [email or web address] URL: [email or web address] Date: 05/11/2024 Prepared by: Lamar Ivory  Exercises - Supine Quadricep Sets  - 3 x daily - 7 x weekly - 2 sets - 10 reps - 5 second hold - Small Range Straight Leg Raise  - 3 x daily - 7 x weekly - 2 sets - 5 reps - 3 seconds hold - Sit to Stand Without Arm Support  - 2 x daily - 7 x weekly - 1 sets - 5 reps - Seated Active Straight-Leg Raise  - 1 x daily - 7 x weekly - 2 sets - 5 reps - 3s hold - Standing Hip Hiking  - 1 x daily - 7 x weekly - 3 sets - 10 reps  - 3 seconds hold  ASSESSMENT:  CLINICAL IMPRESSION: Doctor had objectively better quadriceps strength today as compared to the last objective assessment.  He  is noting less trouble with stairs (particularly descending).  He wants to continue his supervised PT to improve ascending stairs and inclines before transition into independent PT.  OBJECTIVE IMPAIRMENTS: Abnormal gait, decreased activity tolerance, decreased endurance, decreased knowledge of condition, difficulty walking, decreased ROM, decreased strength, increased edema, impaired perceived functional ability, and pain.   ACTIVITY LIMITATIONS: bending, sitting, standing, squatting, stairs, and locomotion level  PARTICIPATION LIMITATIONS: community activity and occupation  PERSONAL FACTORS: Bil knee OA, stage 3 CKD, HTN are also affecting patient's functional outcome.   REHAB POTENTIAL: Good  CLINICAL DECISION MAKING: Evolving/moderate complexity  EVALUATION COMPLEXITY: Moderate   GOALS: Goals reviewed with patient? Yes  SHORT TERM GOALS: Target date: 05/05/2024 Elyan will be independent with his D1 home exercise program Baseline: Started 04/07/2024 Goal status: Met 05/13/2024  2.  Improve bilateral quadriceps strength to 75/50 pounds or better Baseline: 62.3/38.2 pounds respectively Goal status: Partially Met 05/19/2024   LONG TERM GOALS: Target date: 06/02/2024  Improve patient specific functional scale to at least 6 Baseline: 4 Goal status: Ongoing 05/11/2024  2.  Bashir will report bilateral knee pain consistently 2-3/10 on the numeric pain rating scale Baseline: 2-5/10 Goal status: Met 05/19/2024  3.  Improve bilateral quadriceps strength to 80 pounds or greater Baseline: 62.3/38.2 pounds Goal status: INITIAL  4.  Rayen will be independent with his long-term maintenance home exercise program at discharge Baseline: Started 04/07/2024 Goal status: INITIAL  PLAN:  PT FREQUENCY: 2x/week  PT DURATION: 8  weeks  PLANNED INTERVENTIONS: 97750- Physical Performance Testing, 97110-Therapeutic exercises, 97530- Therapeutic activity, 97112- Neuromuscular re-education, 97535- Self Care, 02859- Manual therapy, [id number]- Gait training, [phone]- Electrical stimulation (unattended), 97016- Vasopneumatic device, Patient/Family education, Balance training, Stair training, and Cryotherapy  PLAN FOR NEXT SESSION: Quadriceps strength, hip abductors strength and balance progressions.  Work on ascending stairs and inclines.   Myer LELON Ivory, PT, MPT 05/19/24 2:36 PM

## 2024-05-23 ENCOUNTER — Ambulatory Visit (INDEPENDENT_AMBULATORY_CARE_PROVIDER_SITE_OTHER): Admitting: Medical

## 2024-05-23 VITALS — BP 116/60 | HR 81 | Wt 214.4 lb

## 2024-05-23 DIAGNOSIS — Z8546 Personal history of malignant neoplasm of prostate: Secondary | ICD-10-CM | POA: Diagnosis not present

## 2024-05-23 DIAGNOSIS — G4733 Obstructive sleep apnea (adult) (pediatric): Secondary | ICD-10-CM

## 2024-05-23 DIAGNOSIS — I700 Atherosclerosis of aorta: Secondary | ICD-10-CM

## 2024-05-23 DIAGNOSIS — I10 Essential (primary) hypertension: Secondary | ICD-10-CM

## 2024-05-23 DIAGNOSIS — E790 Hyperuricemia without signs of inflammatory arthritis and tophaceous disease: Secondary | ICD-10-CM | POA: Insufficient documentation

## 2024-05-23 DIAGNOSIS — R7301 Impaired fasting glucose: Secondary | ICD-10-CM | POA: Insufficient documentation

## 2024-05-23 DIAGNOSIS — C7951 Secondary malignant neoplasm of bone: Secondary | ICD-10-CM

## 2024-05-23 DIAGNOSIS — Z7185 Encounter for immunization safety counseling: Secondary | ICD-10-CM

## 2024-05-23 MED ORDER — AMLODIPINE BESYLATE 5 MG PO TABS: 5.0000 mg | ORAL_TABLET | Freq: Every day | ORAL | 1 refills | Status: AC

## 2024-05-23 MED ORDER — ROSUVASTATIN CALCIUM 20 MG PO TABS: 20.0000 mg | ORAL_TABLET | Freq: Every day | ORAL | 1 refills | Status: AC

## 2024-05-23 NOTE — Progress Notes (Signed)
 Name: Tymeer Vaquera   Date of Visit: 05/23/24   Date of last visit with me: 03/01/2024   CHIEF COMPLAINT:  Chief Complaint  Patient presents with   Follow-up    CPAP compliance. 100% from 04/23/24-05/22/2024. Also wants to just update on health       HPI:  Discussed the use of AI scribe software for clinical note transcription with the patient, who gave verbal consent to proceed.  History of Present Illness  Here for follow-up.   Patient Care Team: Cordella Nyquist, Alm RAMAN, PA-C as PCP - General (Family Medicine) Alvaro Ricardo KATHEE Raddle., MD as Consulting Physician (Urology) Pa, Su Dois Moccasin Md as Referring Physician (Otolaryngology) Francois, Abraham Saha, MD as Referring Physician (Gastroenterology) Jeffrie Oneil BROCKS, MD as Consulting Physician (Cardiology) Dr. Evalene Lanes, nephrology  Peter Russell is a 78 year old male with sleep apnea and prostate cancer who presents for follow-up on CPAP compliance and prostate cancer management.  He has experienced improved sleep quality with CPAP therapy, although he continues to wake up three to four times a night to use the bathroom. Apnea events have significantly reduced, with an average of 1.8 events per hour. He has been compliant with CPAP usage, except for a few days during travel.  His blood pressure is managed with valsartan  HCT 160/12.5 mg and amlodipine  5 mg daily, with stable readings. He is on rosuvastatin  for cholesterol management and takes a multivitamin and calcium  plus vitamin D supplement daily. A previous discussion about uric acid levels led to a reduction in his valsartan  HCT dose.  Regarding prostate cancer, he has been on androgen deprivation therapy for four years. A recent bone scan in April 2025 showed inactive spots that appear to be shrinking, with no new areas of concern. He has a history of spots in the femur, vertebrae, and rib, but the most recent scan only showed activity in the sternum.  He experiences knee pain  and was previously on meloxicam , which he discontinued after a month due to lack of efficacy. He is attending physical therapy twice a week to strengthen his quadriceps and abductors.  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 He is a restless sleeper, waking up multiple times at night to use the bathroom.    Current Outpatient Medications on File Prior to Visit  Medication Sig Dispense Refill   calcium -vitamin D (OSCAL WITH D) 500-5 MG-MCG tablet Take 1 tablet by mouth.     Multiple Vitamins-Minerals (EMERGEN-C IMMUNE PO) Take 1 packet by mouth daily.     valsartan -hydrochlorothiazide  (DIOVAN -HCT) 160-12.5 MG tablet Take 1 tablet by mouth daily. 90 tablet 2   XTANDI 40 MG capsule Take 80 mg by mouth daily.     No current facility-administered medications on file prior to visit.     Past Medical History:  Diagnosis Date   Arthritis    sternum and knees   CKD (chronic kidney disease) stage 3, GFR 30-59 ml/min (HCC) 2020   Hypertension    Prostate cancer [facility])    Past Surgical History:  Procedure Laterality Date   APPENDECTOMY     cataract surgery Bilateral    06/2021, 08/2021   ESOPHAGEAL MANOMETRY N/A 04/08/2023   Procedure: ESOPHAGEAL MANOMETRY (EM);  Surgeon: Rollin Dover, MD;  Location: [facility];  Service: Gastroenterology;  Laterality: N/A;   NASAL SINUS SURGERY     PROSTATE CRYOABLATION     ROS as in subjective  Objective: BP 116/60   Pulse 81   Wt 214 lb 6.4 oz (97.3 kg)   SpO2 97%   BMI 30.76 kg/m  Wt Readings from Last 3 Encounters:  05/23/24 214 lb 6.4 oz (97.3 kg)  03/08/24 212 lb 6.4 oz (96.3 kg)  03/01/24 209 lb 9.6 oz (95.1 kg)   General appearence: alert, no distress, WD/WN,  Heart: RRR, normal S1, S2, no murmurs Lungs: CTA bilaterally, no wheezes, rhonchi, or rales ext: no edema Pulses: 2+ symmetric, upper and lower extremities, normal cap refill    Assessment: Encounter Diagnoses   Name Primary?   Essential hypertension, benign Yes   History of prostate cancer    OSA (obstructive sleep apnea)    Vaccine counseling    Malignant neoplasm metastatic to bone [facility])    Aortic atherosclerosis    Impaired fasting blood sugar    Elevated uric acid in blood      Plan: Prostate cancer with bone metastases Prostate cancer with bone metastases is well-managed with androgen deprivation therapy. PSA levels remain undetectable for four years. Bone scan shows inactive spots that appear to be shrinking, indicating effective suppression of testosterone production. - Continue follow-up with urology every three months. - Discuss potential bone density screening with oncologist at next visit.  Chronic kidney disease stage 3 Chronic kidney disease stage 3 is well-managed. Previous uric acid level was 8, which is at the threshold. Advised to monitor dietary intake of high-sugar foods and beverages to manage uric acid levels. - Follow up with nephrologist in three months. - Request uric acid level check during next nephrology visit.  Hyperuricemia Hyperuricemia is being monitored. Declined new prescription for uric acid management and is attempting dietary modifications and increased hydration to manage levels. - Monitor uric acid levels during next blood panel. - Advise on dietary modifications to reduce high-sugar intake.  Essential hypertension Essential hypertension is well-controlled with current medication regimen. Blood pressure readings are stable. - Continue valsartan  HCT 160/12.5 mg daily. - Continue amlodipine  5 mg daily.  Atherosclerosis of aorta Atherosclerosis of the aorta was noted on a recent scan. Cholesterol levels are well-managed with rosuvastatin , and blood tests show good control. - Continue rosuvastatin  as prescribed. - Ensure pharmacy sends refill request for rosuvastatin .  Obstructive sleep apnea Obstructive sleep apnea is well-managed with CPAP  therapy. Average events per hour have significantly decreased to 1.8, which is below the threshold of 5. - Continue CPAP therapy. - Consider requesting monthly compliance reports for personal tracking.   Colston was seen today for follow-up.  Diagnoses and all orders for this visit:  Essential hypertension, benign  History of prostate cancer  OSA (obstructive sleep apnea)  Vaccine counseling  Malignant neoplasm metastatic to bone [facility])  Aortic atherosclerosis  Impaired fasting blood sugar  Elevated uric acid in blood  Other orders -     rosuvastatin  (CRESTOR ) 20 MG tablet; Take 1 tablet (20 mg total) by mouth daily. -     amLODipine  (NORVASC ) 5 MG tablet; Take 1 tablet (5 mg total) by mouth daily.    F/u with specialists as planned

## 2024-05-23 NOTE — Therapy (Signed)
 OUTPATIENT PHYSICAL THERAPY LOWER EXTREMITY TREATMENT NOTE   Patient Name: Peter Russell MRN: [id number] DOB:1946/04/01, 78 y.o., male Today's Date: 05/24/2024   END OF SESSION:  PT End of Session - 05/24/24 1341     Visit Number 12    Number of Visits 16    Date for Recertification  06/02/24    Authorization Type MEDICARE/BCBS    PT Start Time 1304    PT Stop Time 1342    PT Time Calculation (min) 38 min    Activity Tolerance Patient tolerated treatment well    Behavior During Therapy WFL for tasks assessed/performed              Past Medical History:  Diagnosis Date   Arthritis    sternum and knees   CKD (chronic kidney disease) stage 3, GFR 30-59 ml/min (HCC) 2020   Hypertension    Prostate cancer [facility])    Past Surgical History:  Procedure Laterality Date   APPENDECTOMY     cataract surgery Bilateral    06/2021, 08/2021   ESOPHAGEAL MANOMETRY N/A 04/08/2023   Procedure: ESOPHAGEAL MANOMETRY (EM);  Surgeon: Rollin Dover, MD;  Location: [facility];  Service: Gastroenterology;  Laterality: N/A;   NASAL SINUS SURGERY     PROSTATE CRYOABLATION     Patient Active Problem List   Diagnosis Date Noted   Impaired fasting blood sugar 05/23/2024   Elevated uric acid in blood 05/23/2024   Primary osteoarthritis of right knee 03/22/2024   Primary osteoarthritis of left knee 03/22/2024   Sensorineural hearing loss, bilateral 08/27/2023   Impacted cerumen of both ears 08/27/2023   Aortic atherosclerosis 08/25/2023   Achalasia 08/25/2023   History of prostate cancer 08/25/2023   Shortness of breath 05/24/2021   PVCs (premature ventricular contractions) 05/24/2021   Atypical chest pain 05/24/2021   Personal history of rheumatoid arthritis 02/15/2021   Malignant neoplasm metastatic to bone (HCC) 02/15/2021   Screen for colon cancer 02/15/2021   Vision impairment 02/15/2021   Dysphagia 02/15/2021   Abnormal abdominal CT scan 02/15/2021   Screening for heart disease  02/02/2020   Vaccine counseling 02/02/2020   Chronic gout without tophus 02/02/2020   Advance directive discussed with patient 02/02/2020   Prostate cancer (HCC) 09/28/2019   Essential hypertension, benign 09/28/2019   Urine frequency 09/28/2019   OSA (obstructive sleep apnea) 09/28/2019   Vaccine refused by patient 09/28/2019   Medicare annual wellness visit, subsequent 09/28/2019    PCP: Alm RAMAN. Tysinger, PA-C  REFERRING PROVIDER: Kay CHRISTELLA Cummins, MD  REFERRING DIAG: M17.11 (ICD-10-CM) - Primary osteoarthritis of right knee M17.12 (ICD-10-CM) - Primary osteoarthritis of left knee  THERAPY DIAG:  Difficulty in walking, not elsewhere classified  Muscle weakness (generalized)  Localized edema  Stiffness of left knee, not elsewhere classified  Stiffness of right knee, not elsewhere classified  Chronic pain of both knees  Rationale for Evaluation and Treatment: Rehabilitation  ONSET DATE: A year ago  SUBJECTIVE:   SUBJECTIVE STATEMENT: Peter Russell states he feels a little better with stairs .  PERTINENT HISTORY: Bil knee OA, stage 3 CKD, HTN  Peter Russell notes right > left knee pain of at least a year duration.  He is trying Meloxicam .  He notes trouble descending stairs and hills, difficulty squatting.    PAIN:  Are you having pain? Yes: NPRS scale: 3/10  Pain location: Bilateral medial knee joints Pain description: Ache, stiff and sore Aggravating factors: Sloped driveway and ascending stairs Relieving factors: Meloxicam  (none in 6 days),  Change of position and gentle movement  PRECAUTIONS: None  RED FLAGS: None   WEIGHT BEARING RESTRICTIONS: No  FALLS:  Has patient fallen in last 6 months? 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  DIAGNOSTIC FINDINGS: IMPRESSION: 1. Severe degenerative changes of the RIGHT knee medial compartment. 2. Moderate degenerative changes of the LEFT knee medial compartment. 3. Subcortical cyst formation with possible patellar surface irregularity along the medial facet of the LEFT patella likely reflecting underlying cartilage deficiency/injury. 4. Small RIGHT knee joint effusion.  PATIENT SURVEYS:  PSFS: THE PATIENT SPECIFIC FUNCTIONAL SCALE  Place score of 0-10 (0 = unable to perform activity and 10 = able to perform activity at the same level as before injury or problem)  Activity Date: 04/07/2024 05/11/2024   Ascending stairs 6 7   2.  Kneeling 2 2   3.  Descending stairs or a slope like his driveway 4 5   4.      Total Score 4 4.67     Total Score = Sum of activity scores/number of activities  Minimally Detectable Change: 3 points (for single activity); 2 points (for average score)  [facility] (nd). The Patient Specific Functional Scale . Retrieved from [email or web address]   COGNITION: Overall cognitive status: Within functional limits for tasks assessed     SENSATION: Infrequent foot and ankle tingling  EDEMA:  Noted and not objectively assessed   LOWER EXTREMITY ROM:  Active ROM Left/Right 04/07/2024   Hip flexion    Hip extension    Hip abduction    Hip adduction    Hip internal rotation    Hip external rotation    Knee flexion 132/126   Knee extension 3/2   Ankle dorsiflexion    Ankle plantarflexion    Ankle inversion    Ankle eversion     (Blank rows = not tested)  LOWER EXTREMITY STRENGTH:  Assessed in pounds with a hand-held dynamometer Left/Right 04/07/2024 Left/Right 05/04/2024 Left/Right 05/19/2024  Hip flexion     Hip  extension     Hip abduction     Hip adduction     Hip internal rotation     Hip external rotation     Knee flexion     Knee extension 62.3/38.2 67.8/47.4 70.5/51.2  Ankle dorsiflexion     Ankle plantarflexion     Ankle inversion     Ankle eversion      (Blank rows = not tested)  GAIT: Distance walked: 100 feet Assistive device utilized: None Level of assistance: Complete Independence Comments: Peter Russell notes he has difficulty with too much weightbearing or with longer periods of sitting  TREATMENT DATE:  05/24/24 NuStep Level 5, Legs Only 5 minutes Seated straight leg raises 2 sets of 10 for 3 seconds 4#  Functional Activities: Hip hike in parallel bars (to avoid knee valgus) 2 sets of 10 for 3 seconds (lots of feedback and correction required) Sit to stand with 2# ball  2x5  Neuromuscular re-education: Tandem balance on foam eyes open 4 x each foot in front for 20 seconds Dynamic heel to toe walk on foam, forward and back 4 laps     05/19/2024 NuStep Level 5, Legs Only 5 minutes Seated straight leg raises 2 sets of 10 for 3 seconds Seated knee extension machine 90-40 degrees (up both, down 1 leg only with slow eccentric contraction) 15# left and 10# right 15 reps each side (again, needed a rest after 10 reps on the right)  Functional Activities: Hip hike in parallel bars (to avoid knee valgus) 2 sets of 10 for 3 seconds (lots of feedback and correction required)  Neuromuscular re-education: Tandem balance on foam eyes open 4 x each foot in front for 20 seconds Dynamic heel to toe walk on foam, forward and back 4 laps    05/17/2024 TherAct: Step ups with 4 step, low level of difficulty Step ups with 6 step, 2x20  Step downs with 4 step, 2x10 each LE with intermittent use of UE (Rt>Lt) secondary to balance and weakness Bilat leg press 1x12 with 75#   Seated LAQ with 4# ankle weight with focus on eccentric movement, 2x10 each side  Patient demonstrated hip hike in doorway with PT providing verbal cues for pelvic movement instead of knee flexion ; patient performed and described activity in obliques, not glutes; would benefit from performing in parallel bars with mirror for visual feedback  Neuro Re-Ed:  Tandem stance on foam 2x30s each LE  Step over and back with large hurdle 1x8 each LE with high level of difficult  Step over and back with small hurdle 1x8 each LE with improved performance  PT discussed balance strategies and balance systems, especially with compliant vs firm surfaces    05/13/2024 Recumbent bike Seat 7 for 4 minutes Resistance level 4, Goal of 8 MPH (NuStep next time) Seated straight leg raises 2 sets of 10 for 3 seconds Seated knee extension machine 90-40 degrees (up both, down 1 leg only with slow eccentric contraction) 15# left and 10# right 15 reps each side (needed a rest after 10 reps on the right)  Functional Activities: Hip hike in parallel bars (to avoid knee valgus) 2 sets of 10 for 3 seconds (lots of feedback and correction required) Hip hike in door frame 3 x on each side (to avoid lateral lean)  Neuromuscular re-education: Tandem balance on foam eyes open 4 x each foot in front for 20 seconds Single leg balance dynamic, slow marching 20 x  PATIENT EDUCATION:  Education details: See above Person educated: Patient Education method: Explanation, Demonstration, Tactile cues, Verbal cues, and Handouts Education comprehension: verbalized understanding, returned demonstration, verbal cues required, tactile cues required, and needs further education  HOME EXERCISE PROGRAM: Access Code: [email or web address] URL: [email or web address] Date: 05/11/2024 Prepared by: Lamar Ivory  Exercises - Supine Quadricep Sets  - 3 x daily - 7 x weekly - 2 sets - 10 reps - 5 second hold - Small Range Straight Leg  Raise  - 3 x daily - 7 x weekly - 2 sets - 5 reps - 3 seconds hold - Sit to Stand Without Arm Support  -  2 x daily - 7 x weekly - 1 sets - 5 reps - Seated Active Straight-Leg Raise  - 1 x daily - 7 x weekly - 2 sets - 5 reps - 3s hold - Standing Hip Hiking  - 1 x daily - 7 x weekly - 3 sets - 10 reps - 3 seconds hold  ASSESSMENT:  CLINICAL IMPRESSION: Peter Russell needed some UE and bar support with walking in //bars on foam due to challenge.  OBJECTIVE IMPAIRMENTS: Abnormal gait, decreased activity tolerance, decreased endurance, decreased knowledge of condition, difficulty walking, decreased ROM, decreased strength, increased edema, impaired perceived functional ability, and pain.   ACTIVITY LIMITATIONS: bending, sitting, standing, squatting, stairs, and locomotion level  PARTICIPATION LIMITATIONS: community activity and occupation  PERSONAL FACTORS: Bil knee OA, stage 3 CKD, HTN are also affecting patient's functional outcome.   REHAB POTENTIAL: Good  CLINICAL DECISION MAKING: Evolving/moderate complexity  EVALUATION COMPLEXITY: Moderate   GOALS: Goals reviewed with patient? Yes  SHORT TERM GOALS: Target date: 05/05/2024 Peter Russell will be independent with his D1 home exercise program Baseline: Started 04/07/2024 Goal status: Met 05/13/2024  2.  Improve bilateral quadriceps strength to 75/50 pounds or better Baseline: 62.3/38.2 pounds respectively Goal status: Partially Met 05/19/2024   LONG TERM GOALS: Target date: 06/02/2024  Improve patient specific functional scale to at least 6 Baseline: 4 Goal status: Ongoing 05/11/2024  2.  Peter Russell will report bilateral knee pain consistently 2-3/10 on the numeric pain rating scale Baseline: 2-5/10 Goal status: Met 05/19/2024  3.  Improve bilateral quadriceps strength to 80 pounds or greater Baseline: 62.3/38.2 pounds Goal status: INITIAL  4.  Peter Russell will be independent with his long-term maintenance home exercise program at  discharge Baseline: Started 04/07/2024 Goal status: INITIAL  PLAN:  PT FREQUENCY: 2x/week  PT DURATION: 8 weeks  PLANNED INTERVENTIONS: 97750- Physical Performance Testing, 97110-Therapeutic exercises, 97530- Therapeutic activity, 97112- Neuromuscular re-education, 97535- Self Care, [id number]- Manual therapy, [id number]- Gait training, [phone]- Electrical stimulation (unattended), [id number]- Vasopneumatic device, Patient/Family education, Balance training, Stair training, and Cryotherapy  PLAN FOR NEXT SESSION: Quadriceps strength, hip abductors strength and balance progressions.  Work on ascending stairs and inclines.  Burnard Meth, PT 05/24/24  1:43 PM

## 2024-05-24 ENCOUNTER — Ambulatory Visit

## 2024-05-24 DIAGNOSIS — M25661 Stiffness of right knee, not elsewhere classified: Secondary | ICD-10-CM

## 2024-05-24 DIAGNOSIS — R600 Localized edema: Secondary | ICD-10-CM

## 2024-05-24 DIAGNOSIS — M25662 Stiffness of left knee, not elsewhere classified: Secondary | ICD-10-CM

## 2024-05-24 DIAGNOSIS — M25561 Pain in right knee: Secondary | ICD-10-CM

## 2024-05-24 DIAGNOSIS — M6281 Muscle weakness (generalized): Secondary | ICD-10-CM | POA: Diagnosis not present

## 2024-05-24 DIAGNOSIS — G8929 Other chronic pain: Secondary | ICD-10-CM

## 2024-05-24 DIAGNOSIS — M25562 Pain in left knee: Secondary | ICD-10-CM

## 2024-05-24 DIAGNOSIS — R262 Difficulty in walking, not elsewhere classified: Secondary | ICD-10-CM | POA: Diagnosis not present

## 2024-05-26 ENCOUNTER — Ambulatory Visit: Admitting: Rehabilitative and Restorative Service Providers"

## 2024-05-26 ENCOUNTER — Encounter: Payer: Self-pay | Admitting: Rehabilitative and Restorative Service Providers"

## 2024-05-26 DIAGNOSIS — M25561 Pain in right knee: Secondary | ICD-10-CM

## 2024-05-26 DIAGNOSIS — M6281 Muscle weakness (generalized): Secondary | ICD-10-CM | POA: Diagnosis not present

## 2024-05-26 DIAGNOSIS — M25662 Stiffness of left knee, not elsewhere classified: Secondary | ICD-10-CM | POA: Diagnosis not present

## 2024-05-26 DIAGNOSIS — G8929 Other chronic pain: Secondary | ICD-10-CM

## 2024-05-26 DIAGNOSIS — M25661 Stiffness of right knee, not elsewhere classified: Secondary | ICD-10-CM

## 2024-05-26 DIAGNOSIS — R262 Difficulty in walking, not elsewhere classified: Secondary | ICD-10-CM | POA: Diagnosis not present

## 2024-05-26 DIAGNOSIS — R600 Localized edema: Secondary | ICD-10-CM

## 2024-05-26 DIAGNOSIS — M25562 Pain in left knee: Secondary | ICD-10-CM

## 2024-05-26 NOTE — Therapy (Signed)
 OUTPATIENT PHYSICAL THERAPY LOWER EXTREMITY TREATMENT NOTE   Patient Name: Peter Russell MRN: [id number] DOB:1946-02-19, 78 y.o., male Today's Date: 05/26/2024   END OF SESSION:  PT End of Session - 05/26/24 1102     Visit Number 13    Number of Visits 16    Date for Recertification  06/02/24    Authorization Type MEDICARE/BCBS    PT Start Time 1101    PT Stop Time 1145    PT Time Calculation (min) 44 min    Activity Tolerance Patient tolerated treatment well;No increased pain;Patient limited by pain    Behavior During Therapy [facility] for tasks assessed/performed               Past Medical History:  Diagnosis Date   Arthritis    sternum and knees   CKD (chronic kidney disease) stage 3, GFR 30-59 ml/min (HCC) 2020   Hypertension    Prostate cancer [facility])    Past Surgical History:  Procedure Laterality Date   APPENDECTOMY     cataract surgery Bilateral    06/2021, 08/2021   ESOPHAGEAL MANOMETRY N/A 04/08/2023   Procedure: ESOPHAGEAL MANOMETRY (EM);  Surgeon: Peter Dover, MD;  Location: [facility];  Service: Gastroenterology;  Laterality: N/A;   NASAL SINUS SURGERY     PROSTATE CRYOABLATION     Patient Active Problem List   Diagnosis Date Noted   Impaired fasting blood sugar 05/23/2024   Elevated uric acid in blood 05/23/2024   Primary osteoarthritis of right knee 03/22/2024   Primary osteoarthritis of left knee 03/22/2024   Sensorineural hearing loss, bilateral 08/27/2023   Impacted cerumen of both ears 08/27/2023   Aortic atherosclerosis 08/25/2023   Achalasia 08/25/2023   History of prostate cancer 08/25/2023   Shortness of breath 05/24/2021   PVCs (premature ventricular contractions) 05/24/2021   Atypical chest pain 05/24/2021   Personal history of rheumatoid arthritis 02/15/2021   Malignant neoplasm metastatic to bone (HCC) 02/15/2021   Screen for colon cancer 02/15/2021   Vision impairment 02/15/2021   Dysphagia 02/15/2021   Abnormal abdominal CT  scan 02/15/2021   Screening for heart disease 02/02/2020   Vaccine counseling 02/02/2020   Chronic gout without tophus 02/02/2020   Advance directive discussed with patient 02/02/2020   Prostate cancer (HCC) 09/28/2019   Essential hypertension, benign 09/28/2019   Urine frequency 09/28/2019   OSA (obstructive sleep apnea) 09/28/2019   Vaccine refused by patient 09/28/2019   Medicare annual wellness visit, subsequent 09/28/2019    PCP: Peter RAMAN. Tysinger, PA-C  REFERRING PROVIDER: Kay CHRISTELLA Cummins, MD  REFERRING DIAG: M17.11 (ICD-10-CM) - Primary osteoarthritis of right knee M17.12 (ICD-10-CM) - Primary osteoarthritis of left knee  THERAPY DIAG:  Difficulty in walking, not elsewhere classified  Muscle weakness (generalized)  Localized edema  Stiffness of left knee, not elsewhere classified  Stiffness of right knee, not elsewhere classified  Chronic pain of both knees  Rationale for Evaluation and Treatment: Rehabilitation  ONSET DATE: A year ago  SUBJECTIVE:   SUBJECTIVE STATEMENT: Peter Russell notes stairs and inclines continue to be most limiting.  He notes significant improvements in his day-to-day pain and with his ability to descend stairs.  He has occasional knee pain at night.    PERTINENT HISTORY: Bil knee OA, stage 3 CKD, HTN  Peter Russell notes right > left knee pain of at least a year duration.  He is trying Meloxicam .  He notes trouble descending stairs and hills, difficulty squatting.    PAIN:  Are you having  pain? Yes: NPRS scale: 2.5-4/10 this week Pain location: Bilateral medial knee joints Pain description: Ache, stiff and sore Aggravating factors: Sloped driveway and ascending stairs Relieving factors: Meloxicam  (none in 6 days), Change of position and gentle movement  PRECAUTIONS: None  RED FLAGS: None   WEIGHT BEARING RESTRICTIONS: No  FALLS:  Has patient fallen in last 6 months? 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  DIAGNOSTIC FINDINGS: IMPRESSION: 1. Severe degenerative changes of the RIGHT knee medial compartment. 2. Moderate degenerative changes of the LEFT knee medial compartment. 3. Subcortical cyst formation with possible patellar surface irregularity along the medial facet of the LEFT patella likely reflecting underlying cartilage deficiency/injury. 4. Small RIGHT knee joint effusion.  PATIENT SURVEYS:  PSFS: THE PATIENT SPECIFIC FUNCTIONAL SCALE  Place score of 0-10 (0 = unable to perform activity and 10 = able to perform activity at the same level as before injury or problem)  Activity Date: 04/07/2024 05/11/2024 05/26/2024  Ascending stairs 6 7 8   2.  Kneeling 2 2 2   3.  Descending stairs or a slope like his driveway 4 5 9   4.      Total Score 4 4.67 6.33    Total Score = Sum of activity scores/number of activities  Minimally Detectable Change: 3 points (for single activity); 2 points (for average score)  [name] (nd). The Patient Specific Functional Scale . Retrieved from [email or web address]   COGNITION: Overall cognitive status: Within functional limits for tasks assessed     SENSATION: Infrequent foot and ankle tingling  EDEMA:  Noted and not objectively assessed   LOWER EXTREMITY ROM:  Active ROM Left/Right 04/07/2024   Hip flexion    Hip extension    Hip abduction    Hip adduction    Hip internal rotation    Hip external rotation    Knee flexion 132/126   Knee extension 3/2   Ankle dorsiflexion    Ankle plantarflexion     Ankle inversion    Ankle eversion     (Blank rows = not tested)  LOWER EXTREMITY STRENGTH:  Assessed in pounds with a hand-held dynamometer Left/Right 04/07/2024 Left/Right 05/04/2024 Left/Right 05/19/2024  Hip flexion     Hip extension     Hip abduction     Hip adduction     Hip internal rotation     Hip external rotation     Knee flexion     Knee extension 62.3/38.2 67.8/47.4 70.5/51.2  Ankle dorsiflexion     Ankle plantarflexion     Ankle inversion     Ankle eversion      (Blank rows = not tested)  GAIT: Distance walked: 100 feet Assistive device utilized: None Level of assistance: Complete Independence Comments: Eva notes he has difficulty with too much weightbearing or with longer periods of sitting  TREATMENT DATE:  05/26/2024 NuStep Level 6 for 7 minutes Seated straight leg raises 2 sets of 10 for 3 seconds Seated knee extension machine 90-40 degrees (up both, down 1 leg only with slow eccentric contraction) 15# left and 10# right 15 reps each side (again, needed a rest after 10 reps on the right)  Functional Activities: Single Leg Press 31# 15 x slow eccentrics  Step-up and over 4 and 8 inch step with slow eccentrics and 1 hand assist 10 x each Step-down off 4 inch step (as far as comfortable) 10 x slow eccentrics   05/24/24 NuStep Level 5, Legs Only 5 minutes Seated straight leg raises 2 sets of 10 for 3 seconds 4#  Functional Activities: Hip hike in parallel bars (to avoid knee valgus) 2 sets of 10 for 3 seconds (lots of feedback and correction required) Sit to stand with 2# ball  2x5  Neuromuscular re-education: Tandem balance on foam eyes open 4 x each foot in front for 20 seconds Dynamic heel to toe walk on foam, forward and back 4 laps   05/19/2024 NuStep Level 5, Legs Only 5 minutes Seated straight leg raises 2 sets of 10 for 3  seconds Seated knee extension machine 90-40 degrees (up both, down 1 leg only with slow eccentric contraction) 15# left and 10# right 15 reps each side (again, needed a rest after 10 reps on the right)  Functional Activities: Hip hike in parallel bars (to avoid knee valgus) 2 sets of 10 for 3 seconds (lots of feedback and correction required)  Neuromuscular re-education: Tandem balance on foam eyes open 4 x each foot in front for 20 seconds Dynamic heel to toe walk on foam, forward and back 4 laps   PATIENT EDUCATION:  Education details: See above Person educated: Patient Education method: Explanation, Demonstration, Tactile cues, Verbal cues, and Handouts Education comprehension: verbalized understanding, returned demonstration, verbal cues required, tactile cues required, and needs further education  HOME EXERCISE PROGRAM: Access Code: [email or web address] URL: [email or web address] Date: 05/11/2024 Prepared by: Lamar Ivory  Exercises - Supine Quadricep Sets  - 3 x daily - 7 x weekly - 2 sets - 10 reps - 5 second hold - Small Range Straight Leg Raise  - 3 x daily - 7 x weekly - 2 sets - 5 reps - 3 seconds hold - Sit to Stand Without Arm Support  - 2 x daily - 7 x weekly - 1 sets - 5 reps - Seated Active Straight-Leg Raise  - 1 x daily - 7 x weekly - 2 sets - 5 reps - 3s hold - Standing Hip Hiking  - 1 x daily - 7 x weekly - 3 sets - 10 reps - 3 seconds hold  ASSESSMENT:  CLINICAL IMPRESSION: Cavan notes significant progress with his bilateral knee function since starting supervised PT.  His left knee has responded better than his right, although both have improved.  Emphasis remains quadriceps strength with his long-term HEP.  We discussed appropriate equipment as he is considering joining a gym.  We will continue to focus on quadriceps strength and try to improve his ability to ascend stairs and inclines before transfer into independent rehabilitation.  OBJECTIVE  IMPAIRMENTS: Abnormal gait, decreased activity tolerance, decreased endurance, decreased knowledge of condition, difficulty walking, decreased ROM, decreased strength, increased edema, impaired perceived functional ability, and pain.   ACTIVITY LIMITATIONS: bending, sitting, standing, squatting, stairs, and locomotion level  PARTICIPATION LIMITATIONS: community activity and occupation  PERSONAL FACTORS: Bil knee OA, stage  3 CKD, HTN are also affecting patient's functional outcome.   REHAB POTENTIAL: Good  CLINICAL DECISION MAKING: Evolving/moderate complexity  EVALUATION COMPLEXITY: Moderate   GOALS: Goals reviewed with patient? Yes  SHORT TERM GOALS: Target date: 05/05/2024 Amit will be independent with his D1 home exercise program Baseline: Started 04/07/2024 Goal status: Met 05/13/2024  2.  Improve bilateral quadriceps strength to 75/50 pounds or better Baseline: 62.3/38.2 pounds respectively Goal status: Partially Met 05/19/2024   LONG TERM GOALS: Target date: 06/02/2024  Improve patient specific functional scale to at least 6 Baseline: 4 Goal status: Met 05/26/2024  2.  Taji will report bilateral knee pain consistently 2-3/10 on the numeric pain rating scale Baseline: 2-5/10 Goal status: Met 05/19/2024  3.  Improve bilateral quadriceps strength to 80 pounds or greater Baseline: 62.3/38.2 pounds Goal status: INITIAL  4.  Ferd will be independent with his long-term maintenance home exercise program at discharge Baseline: Started 04/07/2024 Goal status: INITIAL  PLAN:  PT FREQUENCY: 2x/week  PT DURATION: 8 weeks  PLANNED INTERVENTIONS: 97750- Physical Performance Testing, 97110-Therapeutic exercises, 97530- Therapeutic activity, 97112- Neuromuscular re-education, 97535- Self Care, [id number]- Manual therapy, [id number]- Gait training, [phone]- Electrical stimulation (unattended), [id number]- Vasopneumatic device, Patient/Family education, Balance training, Stair training, and  Cryotherapy  PLAN FOR NEXT SESSION: Quadriceps strength, hip abductors strength and balance progressions.  Work on ascending stairs and inclines.  Myer LELON Ivory, PT, MPT 05/26/24  5:16 PM

## 2024-05-30 NOTE — Therapy (Signed)
 OUTPATIENT PHYSICAL THERAPY LOWER EXTREMITY TREATMENT NOTE/RECERTIFICATION   Patient Name: Peter Russell MRN: [id number] DOB:1946/07/04, 78 y.o., male Today's Date: 05/31/2024   END OF SESSION:  PT End of Session - 05/31/24 1552     Visit Number 14    Number of Visits 28    Date for Recertification  07/12/24    Authorization Type MEDICARE/BCBS    PT Start Time 1430    PT Stop Time 1510    PT Time Calculation (min) 40 min    Activity Tolerance Patient tolerated treatment well    Behavior During Therapy [facility] for tasks assessed/performed                Past Medical History:  Diagnosis Date   Arthritis    sternum and knees   CKD (chronic kidney disease) stage 3, GFR 30-59 ml/min (HCC) 2020   Hypertension    Prostate cancer [facility])    Past Surgical History:  Procedure Laterality Date   APPENDECTOMY     cataract surgery Bilateral    06/2021, 08/2021   ESOPHAGEAL MANOMETRY N/A 04/08/2023   Procedure: ESOPHAGEAL MANOMETRY (EM);  Surgeon: Rollin Dover, MD;  Location: [facility];  Service: Gastroenterology;  Laterality: N/A;   NASAL SINUS SURGERY     PROSTATE CRYOABLATION     Patient Active Problem List   Diagnosis Date Noted   Impaired fasting blood sugar 05/23/2024   Elevated uric acid in blood 05/23/2024   Primary osteoarthritis of right knee 03/22/2024   Primary osteoarthritis of left knee 03/22/2024   Sensorineural hearing loss, bilateral 08/27/2023   Impacted cerumen of both ears 08/27/2023   Aortic atherosclerosis 08/25/2023   Achalasia 08/25/2023   History of prostate cancer 08/25/2023   Shortness of breath 05/24/2021   PVCs (premature ventricular contractions) 05/24/2021   Atypical chest pain 05/24/2021   Personal history of rheumatoid arthritis 02/15/2021   Malignant neoplasm metastatic to bone (HCC) 02/15/2021   Screen for colon cancer 02/15/2021   Vision impairment 02/15/2021   Dysphagia 02/15/2021   Abnormal abdominal CT scan 02/15/2021    Screening for heart disease 02/02/2020   Vaccine counseling 02/02/2020   Chronic gout without tophus 02/02/2020   Advance directive discussed with patient 02/02/2020   Prostate cancer (HCC) 09/28/2019   Essential hypertension, benign 09/28/2019   Urine frequency 09/28/2019   OSA (obstructive sleep apnea) 09/28/2019   Vaccine refused by patient 09/28/2019   Medicare annual wellness visit, subsequent 09/28/2019    PCP: Alm RAMAN. Tysinger, PA-C  REFERRING PROVIDER: Kay CHRISTELLA Cummins, MD  REFERRING DIAG: M17.11 (ICD-10-CM) - Primary osteoarthritis of right knee M17.12 (ICD-10-CM) - Primary osteoarthritis of left knee  THERAPY DIAG:  Difficulty in walking, not elsewhere classified  Muscle weakness (generalized)  Localized edema  Stiffness of left knee, not elsewhere classified  Stiffness of right knee, not elsewhere classified  Chronic pain of both knees  Rationale for Evaluation and Treatment: Rehabilitation  ONSET DATE: A year ago  SUBJECTIVE:   SUBJECTIVE STATEMENT: Peter Russell states stairs and his driveway incline/decline are still  not easy but better.  PERTINENT HISTORY: Bil knee OA, stage 3 CKD, HTN  Peter Russell notes right > left knee pain of at least a year duration.  He is trying Meloxicam .  He notes trouble descending stairs and hills, difficulty squatting.    PAIN:  Are you having pain? Yes: NPRS scale: 3/10 this week Pain location: Bilateral medial knee joints Pain description: Ache, stiff and sore Aggravating factors: Sloped driveway and ascending stairs  Relieving factors: Meloxicam  (none in 6 days), Change of position and gentle movement  PRECAUTIONS: None  RED FLAGS: None   WEIGHT BEARING RESTRICTIONS: No  FALLS:  Has patient fallen in last 6 months? 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  DIAGNOSTIC FINDINGS: IMPRESSION: 1. Severe degenerative changes of the RIGHT knee medial compartment. 2. Moderate degenerative changes of the LEFT knee medial compartment. 3. Subcortical cyst formation with possible patellar surface irregularity along the medial facet of the LEFT patella likely reflecting underlying cartilage deficiency/injury. 4. Small RIGHT knee joint effusion.  PATIENT SURVEYS:  PSFS: THE PATIENT SPECIFIC FUNCTIONAL SCALE  Place score of 0-10 (0 = unable to perform activity and 10 = able to perform activity at the same level as before injury or problem)  Activity Date: 04/07/2024 05/11/2024 05/26/2024  Ascending stairs 6 7 8   2.  Kneeling 2 2 2   3.  Descending stairs or a slope like his driveway 4 5 9   4.      Total Score 4 4.67 6.33    Total Score = Sum of activity scores/number of activities  Minimally Detectable Change: 3 points (for single activity); 2 points (for average score)  [facility] (nd). The Patient Specific Functional Scale . Retrieved from [email or web address]   COGNITION: Overall cognitive status: Within functional limits for tasks assessed     SENSATION: Infrequent foot and ankle tingling  EDEMA:  Noted and not objectively assessed   LOWER EXTREMITY ROM:  Active ROM Left/Right 04/07/2024   Hip flexion    Hip extension    Hip abduction    Hip adduction    Hip internal rotation    Hip external rotation    Knee flexion 132/126   Knee extension 3/2   Ankle dorsiflexion    Ankle plantarflexion    Ankle inversion    Ankle eversion     (Blank rows = not tested)  LOWER EXTREMITY STRENGTH:  Assessed in pounds with a  hand-held dynamometer Left/Right 04/07/2024 Left/Right 05/04/2024 Left/Right 05/19/2024 Left/Right 05/31/24  Hip flexion      Hip extension      Hip abduction      Hip adduction      Hip internal rotation      Hip external rotation      Knee flexion      Knee extension 62.3/38.2 67.8/47.4 70.5/51.2 61.9/67.9  Ankle dorsiflexion      Ankle plantarflexion      Ankle inversion      Ankle eversion       (Blank rows = not tested)  GAIT: Distance walked: 100 feet Assistive device utilized: None Level of assistance: Complete Independence Comments: Peter Russell notes he has difficulty with too much weightbearing or with longer periods of sitting  TREATMENT DATE:  05/31/24 There ex Seated straight leg raises 2 sets of 10 for 3 seconds Rec bike 3 min Functional Activities: Single Leg Press 31# 15 x slow eccentrics  Hip hike in parallel bars (to avoid knee valgus) 2 sets of 10 for 3 seconds Step ups 4 inch Eccentric tap downs with 2 inch box  05/26/2024 NuStep Level 6 for 7 minutes Seated straight leg raises 2 sets of 10 for 3 seconds Seated knee extension machine 90-40 degrees (up both, down 1 leg only with slow eccentric contraction) 15# left and 10# right 15 reps each side (again, needed a rest after 10 reps on the right)  Functional Activities: Single Leg Press 31# 15 x slow eccentrics  Step-up and over 4 and 8 inch step with slow eccentrics and 1 hand assist 10 x each Step-down off 4 inch step (as far as comfortable) 10 x slow eccentrics   05/24/24 NuStep Level 5, Legs Only 5 minutes Seated straight leg raises 2 sets of 10 for 3 seconds 4#  Functional Activities: Hip hike in parallel bars (to avoid knee valgus) 2 sets of 10 for 3 seconds (lots of feedback and correction required) Sit to stand with 2# ball  2x5  Neuromuscular re-education: Tandem balance on foam  eyes open 4 x each foot in front for 20 seconds Dynamic heel to toe walk on foam, forward and back 4 laps   05/19/2024 NuStep Level 5, Legs Only 5 minutes Seated straight leg raises 2 sets of 10 for 3 seconds Seated knee extension machine 90-40 degrees (up both, down 1 leg only with slow eccentric contraction) 15# left and 10# right 15 reps each side (again, needed a rest after 10 reps on the right)  Functional Activities: Hip hike in parallel bars (to avoid knee valgus) 2 sets of 10 for 3 seconds (lots of feedback and correction required)  Neuromuscular re-education: Tandem balance on foam eyes open 4 x each foot in front for 20 seconds Dynamic heel to toe walk on foam, forward and back 4 laps   PATIENT EDUCATION:  Education details: See above Person educated: Patient Education method: Explanation, Demonstration, Tactile cues, Verbal cues, and Handouts Education comprehension: verbalized understanding, returned demonstration, verbal cues required, tactile cues required, and needs further education  HOME EXERCISE PROGRAM: Access Code: [email or web address] URL: [email or web address] Date: 05/11/2024 Prepared by: Lamar Ivory  Exercises - Supine Quadricep Sets  - 3 x daily - 7 x weekly - 2 sets - 10 reps - 5 second hold - Small Range Straight Leg Raise  - 3 x daily - 7 x weekly - 2 sets - 5 reps - 3 seconds hold - Sit to Stand Without Arm Support  - 2 x daily - 7 x weekly - 1 sets - 5 reps - Seated Active Straight-Leg Raise  - 1 x daily - 7 x weekly - 2 sets - 5 reps - 3s hold - Standing Hip Hiking  - 1 x daily - 7 x weekly - 3 sets - 10 reps - 3 seconds hold  ASSESSMENT:  CLINICAL IMPRESSION: Taegen has completed STG and is halway through his LTG.  Strength slowly increasing and knee pain decreasing.  He would benefit from continued skilled PT to complete the LTG. OBJECTIVE IMPAIRMENTS: Abnormal gait, decreased activity tolerance, decreased endurance, decreased knowledge of  condition, difficulty walking, decreased ROM, decreased strength, increased edema, impaired perceived functional ability, and pain.   ACTIVITY LIMITATIONS: bending, sitting, standing, squatting, stairs, and locomotion level  PARTICIPATION LIMITATIONS: community activity and occupation  PERSONAL FACTORS: Bil knee OA, stage 3 CKD, HTN are also affecting patient's functional outcome.   REHAB POTENTIAL: Good  CLINICAL DECISION MAKING: Evolving/moderate complexity  EVALUATION COMPLEXITY: Moderate   GOALS: Goals reviewed with patient? Yes  SHORT TERM GOALS: Target date: 05/05/2024 Lyndon will be independent with his D1 home exercise program Baseline: Started 04/07/2024 Goal status: Met 05/13/2024  2.  Improve bilateral quadriceps strength to 75/50 pounds or better Baseline: 62.3/38.2 pounds respectively Goal status: Partially Met 05/31/24   LONG TERM GOALS: Target date:07/12/2024 6weeks     Improve patient specific functional scale to at least 6 Baseline: 4 Goal status: Met 05/26/2024  2.  Lyden will report bilateral knee pain consistently 2-3/10 on the numeric pain rating scale Baseline: 2-5/10 Goal status: Met 05/19/2024  3.  Improve bilateral quadriceps strength to 80 pounds or greater Baseline: 62.3/38.2 pounds Goal status: Ongoing 05/31/24 4.  Cohl will be independent with his long-term maintenance home exercise program at discharge Baseline: Started 04/07/2024 Goal status:Ongoing 05/31/24 PLAN:  PT FREQUENCY:1-2x week  PT DURATION: 6 weeks  PLANNED INTERVENTIONS: 97750- Physical Performance Testing, 97110-Therapeutic exercises, 97530- Therapeutic activity, 97112- Neuromuscular re-education, 97535- Self Care, 02859- Manual therapy, [id number]- Gait training, [phone]- Electrical stimulation (unattended), [id number]- Vasopneumatic device, Patient/Family education, Balance training, Stair training, and Cryotherapy  PLAN FOR NEXT SESSION: Quadriceps strength, hip abductors strength  and balance progressions.  Work on ascending stairs and inclines.  Burnard Meth, PT 05/31/24  3:55 PM

## 2024-05-31 ENCOUNTER — Ambulatory Visit (INDEPENDENT_AMBULATORY_CARE_PROVIDER_SITE_OTHER)

## 2024-05-31 DIAGNOSIS — M6281 Muscle weakness (generalized): Secondary | ICD-10-CM

## 2024-05-31 DIAGNOSIS — M25662 Stiffness of left knee, not elsewhere classified: Secondary | ICD-10-CM | POA: Diagnosis not present

## 2024-05-31 DIAGNOSIS — R600 Localized edema: Secondary | ICD-10-CM | POA: Diagnosis not present

## 2024-05-31 DIAGNOSIS — R262 Difficulty in walking, not elsewhere classified: Secondary | ICD-10-CM | POA: Diagnosis not present

## 2024-05-31 DIAGNOSIS — M25661 Stiffness of right knee, not elsewhere classified: Secondary | ICD-10-CM

## 2024-05-31 DIAGNOSIS — G8929 Other chronic pain: Secondary | ICD-10-CM

## 2024-05-31 DIAGNOSIS — M25561 Pain in right knee: Secondary | ICD-10-CM

## 2024-05-31 DIAGNOSIS — M25562 Pain in left knee: Secondary | ICD-10-CM

## 2024-06-09 ENCOUNTER — Encounter: Admitting: Rehabilitative and Restorative Service Providers"

## 2024-06-12 NOTE — Therapy (Signed)
 OUTPATIENT PHYSICAL THERAPY LOWER EXTREMITY TREATMENT NOTE   Patient Name: Peter Russell MRN: [id number] DOB:[birth year], 78 y.o., male Today's Date: 06/14/2024   END OF SESSION:  PT End of Session - 06/14/24 1056     Visit Number 15    Number of Visits 28    Date for Recertification  07/12/24    Authorization Type MEDICARE/BCBS    PT Start Time 1015    PT Stop Time 1053    PT Time Calculation (min) 38 min    Activity Tolerance Patient tolerated treatment well    Behavior During Therapy WFL for tasks assessed/performed                 Past Medical History:  Diagnosis Date   Arthritis    sternum and knees   CKD (chronic kidney disease) stage 3, GFR 30-59 ml/min (HCC) 2020   Hypertension    Prostate cancer [facility])    Past Surgical History:  Procedure Laterality Date   APPENDECTOMY     cataract surgery Bilateral    06/2021, 08/2021   ESOPHAGEAL MANOMETRY N/A 04/08/2023   Procedure: ESOPHAGEAL MANOMETRY (EM);  Surgeon: Rollin Dover, MD;  Location: [facility];  Service: Gastroenterology;  Laterality: N/A;   NASAL SINUS SURGERY     PROSTATE CRYOABLATION     Patient Active Problem List   Diagnosis Date Noted   Impaired fasting blood sugar 05/23/2024   Elevated uric acid in blood 05/23/2024   Primary osteoarthritis of right knee 03/22/2024   Primary osteoarthritis of left knee 03/22/2024   Sensorineural hearing loss, bilateral 08/27/2023   Impacted cerumen of both ears 08/27/2023   Aortic atherosclerosis 08/25/2023   Achalasia 08/25/2023   History of prostate cancer 08/25/2023   Shortness of breath 05/24/2021   PVCs (premature ventricular contractions) 05/24/2021   Atypical chest pain 05/24/2021   Personal history of rheumatoid arthritis 02/15/2021   Malignant neoplasm metastatic to bone (HCC) 02/15/2021   Screen for colon cancer 02/15/2021   Vision impairment 02/15/2021   Dysphagia 02/15/2021   Abnormal abdominal CT scan 02/15/2021   Screening for heart  disease 02/02/2020   Vaccine counseling 02/02/2020   Chronic gout without tophus 02/02/2020   Advance directive discussed with patient 02/02/2020   Prostate cancer (HCC) 09/28/2019   Essential hypertension, benign 09/28/2019   Urine frequency 09/28/2019   OSA (obstructive sleep apnea) 09/28/2019   Vaccine refused by patient 09/28/2019   Medicare annual wellness visit, subsequent 09/28/2019    PCP: Alm RAMAN. Tysinger, PA-C  REFERRING PROVIDER: Kay CHRISTELLA Cummins, MD  REFERRING DIAG: M17.11 (ICD-10-CM) - Primary osteoarthritis of right knee M17.12 (ICD-10-CM) - Primary osteoarthritis of left knee  THERAPY DIAG:  Difficulty in walking, not elsewhere classified  Muscle weakness (generalized)  Localized edema  Stiffness of left knee, not elsewhere classified  Stiffness of right knee, not elsewhere classified  Chronic pain of both knees  Rationale for Evaluation and Treatment: Rehabilitation  ONSET DATE: A year ago  SUBJECTIVE:   SUBJECTIVE STATEMENT: Pt reports he was sick and missed appointments and did not do HEP much.  Feels better now but endurance low. PERTINENT HISTORY: Bil knee OA, stage 3 CKD, HTN  Kha notes right > left knee pain of at least a year duration.  He is trying Meloxicam .  He notes trouble descending stairs and hills, difficulty squatting.    PAIN:  Are you having pain? Yes: NPRS scale: 2/10 this week Pain location: Bilateral medial knee joints Pain description: Ache, stiff and sore  Aggravating factors: Sloped driveway and ascending stairs Relieving factors: Meloxicam  (none in 6 days), Change of position and gentle movement  PRECAUTIONS: None  RED FLAGS: None   WEIGHT BEARING RESTRICTIONS: No  FALLS:  Has patient fallen in last 6 months? 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  DIAGNOSTIC FINDINGS: IMPRESSION: 1. Severe degenerative changes of the RIGHT knee medial compartment. 2. Moderate degenerative changes of the LEFT knee medial compartment. 3. Subcortical cyst formation with possible patellar surface irregularity along the medial facet of the LEFT patella likely reflecting underlying cartilage deficiency/injury. 4. Small RIGHT knee joint effusion.  PATIENT SURVEYS:  PSFS: THE PATIENT SPECIFIC FUNCTIONAL SCALE  Place score of 0-10 (0 = unable to perform activity and 10 = able to perform activity at the same level as before injury or problem)  Activity Date: 04/07/2024 05/11/2024 05/26/2024  Ascending stairs 6 7 8   2.  Kneeling 2 2 2   3.  Descending stairs or a slope like his driveway 4 5 9   4.      Total Score 4 4.67 6.33    Total Score = Sum of activity scores/number of activities  Minimally Detectable Change: 3 points (for single activity); 2 points (for average score)  [name] (nd). The Patient Specific Functional Scale . Retrieved from Skateoasis.com.pt   COGNITION: Overall cognitive status: Within functional limits for tasks assessed     SENSATION: Infrequent foot and ankle tingling  EDEMA:  Noted and not objectively assessed   LOWER EXTREMITY ROM:  Active ROM Left/Right 04/07/2024   Hip flexion    Hip extension    Hip abduction    Hip adduction    Hip internal rotation    Hip external rotation    Knee flexion 132/126   Knee extension 3/2   Ankle dorsiflexion    Ankle plantarflexion    Ankle inversion    Ankle eversion     (Blank rows = not tested)  LOWER EXTREMITY STRENGTH:  Assessed in pounds with a  hand-held dynamometer Left/Right 04/07/2024 Left/Right 05/04/2024 Left/Right 05/19/2024 Left/Right 05/31/24  Hip flexion      Hip extension      Hip abduction      Hip adduction      Hip internal rotation      Hip external rotation      Knee flexion      Knee extension 62.3/38.2 67.8/47.4 70.5/51.2 61.9/67.9  Ankle dorsiflexion      Ankle plantarflexion      Ankle inversion      Ankle eversion       (Blank rows = not tested)  GAIT: Distance walked: 100 feet Assistive device utilized: None Level of assistance: Complete Independence Comments: Kyal notes he has difficulty with too much weightbearing or with longer periods of sitting  TREATMENT DATE:  06/14/24 NuStep Level 5, Legs Only 6 minutes Seated straight leg raises 2 x 10 for 3 seconds 4#  Functional Activities: Hip hike in parallel bars (to avoid knee valgus) 2 sets of 10 for 3 seconds (lots of feedback and correction required) Sit to stand with 2# ball  2x8  Neuromuscular re-education: Tandem balance on foam eyes open 4 x each foot in front for 20 seconds Dynamic heel to toe walk on foam, forward and back 4 laps    05/31/24 There ex Seated straight leg raises 2 sets of 10 for 3 seconds Rec bike 3 min Functional Activities: Single Leg Press 31# 15 x slow eccentrics  Hip hike in parallel bars (to avoid knee valgus) 2 sets of 10 for 3 seconds Step ups 4 inch Eccentric tap downs with 2 inch box  05/26/2024 NuStep Level 6 for 7 minutes Seated straight leg raises 2 sets of 10 for 3 seconds Seated knee extension machine 90-40 degrees (up both, down 1 leg only with slow eccentric contraction) 15# left and 10# right 15 reps each side (again, needed a rest after 10 reps on the right)  Functional Activities: Single Leg Press 31# 15 x slow eccentrics  Step-up and over 4 and 8 inch step with slow  eccentrics and 1 hand assist 10 x each Step-down off 4 inch step (as far as comfortable) 10 x slow eccentrics   05/24/24 NuStep Level 5, Legs Only 5 minutes Seated straight leg raises 2 sets of 10 for 3 seconds 4#  Functional Activities: Hip hike in parallel bars (to avoid knee valgus) 2 sets of 10 for 3 seconds (lots of feedback and correction required) Sit to stand with 2# ball  2x5  Neuromuscular re-education: Tandem balance on foam eyes open 4 x each foot in front for 20 seconds Dynamic heel to toe walk on foam, forward and back 4 laps   05/19/2024 NuStep Level 5, Legs Only 5 minutes Seated straight leg raises 2 sets of 10 for 3 seconds Seated knee extension machine 90-40 degrees (up both, down 1 leg only with slow eccentric contraction) 15# left and 10# right 15 reps each side (again, needed a rest after 10 reps on the right)  Functional Activities: Hip hike in parallel bars (to avoid knee valgus) 2 sets of 10 for 3 seconds (lots of feedback and correction required)  Neuromuscular re-education: Tandem balance on foam eyes open 4 x each foot in front for 20 seconds Dynamic heel to toe walk on foam, forward and back 4 laps   PATIENT EDUCATION:  Education details: See above Person educated: Patient Education method: Explanation, Demonstration, Tactile cues, Verbal cues, and Handouts Education comprehension: verbalized understanding, returned demonstration, verbal cues required, tactile cues required, and needs further education  HOME EXERCISE PROGRAM: Access Code: [email or web address] URL: [email or web address] Date: 05/11/2024 Prepared by: Lamar Ivory  Exercises - Supine Quadricep Sets  - 3 x daily - 7 x weekly - 2 sets - 10 reps - 5 second hold - Small Range Straight Leg Raise  - 3 x daily - 7 x weekly - 2 sets - 5 reps - 3 seconds hold - Sit to Stand Without Arm Support  - 2 x daily - 7 x weekly - 1 sets - 5 reps - Seated Active Straight-Leg Raise  - 1 x daily -  7 x weekly - 2 sets - 5 reps - 3s hold - Standing Hip Hiking  - 1 x daily - 7 x weekly -  3 sets - 10 reps - 3 seconds hold  ASSESSMENT:  CLINICAL IMPRESSION: Aviv needed assist from //bars while doing balance exercises due to increased sway and some LOB.  LOB corrected independently .  OBJECTIVE IMPAIRMENTS: Abnormal gait, decreased activity tolerance, decreased endurance, decreased knowledge of condition, difficulty walking, decreased ROM, decreased strength, increased edema, impaired perceived functional ability, and pain.   ACTIVITY LIMITATIONS: bending, sitting, standing, squatting, stairs, and locomotion level  PARTICIPATION LIMITATIONS: community activity and occupation  PERSONAL FACTORS: Bil knee OA, stage 3 CKD, HTN are also affecting patient's functional outcome.   REHAB POTENTIAL: Good  CLINICAL DECISION MAKING: Evolving/moderate complexity  EVALUATION COMPLEXITY: Moderate   GOALS: Goals reviewed with patient? Yes  SHORT TERM GOALS: Target date: 05/05/2024 Taher will be independent with his D1 home exercise program Baseline: Started 04/07/2024 Goal status: Met 05/13/2024  2.  Improve bilateral quadriceps strength to 75/50 pounds or better Baseline: 62.3/38.2 pounds respectively Goal status: Partially Met 05/31/24   LONG TERM GOALS: Target date:07/12/2024 6weeks     Improve patient specific functional scale to at least 6 Baseline: 4 Goal status: Met 05/26/2024  2.  Cleto will report bilateral knee pain consistently 2-3/10 on the numeric pain rating scale Baseline: 2-5/10 Goal status: Met 05/19/2024  3.  Improve bilateral quadriceps strength to 80 pounds or greater Baseline: 62.3/38.2 pounds Goal status: Ongoing 05/31/24 4.  Tyreik will be independent with his long-term maintenance home exercise program at discharge Baseline: Started 04/07/2024 Goal status:Ongoing 05/31/24 PLAN:  PT FREQUENCY:1-2x week  PT DURATION: 6 weeks  PLANNED INTERVENTIONS:  97750- Physical Performance Testing, 97110-Therapeutic exercises, 97530- Therapeutic activity, 97112- Neuromuscular re-education, 97535- Self Care, 02859- Manual therapy, [id number]- Gait training, [phone]- Electrical stimulation (unattended), 97016- Vasopneumatic device, Patient/Family education, Balance training, Stair training, and Cryotherapy  PLAN FOR NEXT SESSION: Quadriceps strength, hip abductors strength and balance progressions.  Work on ascending stairs and inclines.  Burnard Meth, PT 06/14/24  10:57 AM

## 2024-06-13 ENCOUNTER — Encounter: Payer: Self-pay | Admitting: Radiology

## 2024-06-14 ENCOUNTER — Ambulatory Visit (INDEPENDENT_AMBULATORY_CARE_PROVIDER_SITE_OTHER)

## 2024-06-14 DIAGNOSIS — R262 Difficulty in walking, not elsewhere classified: Secondary | ICD-10-CM | POA: Diagnosis not present

## 2024-06-14 DIAGNOSIS — M25662 Stiffness of left knee, not elsewhere classified: Secondary | ICD-10-CM | POA: Diagnosis not present

## 2024-06-14 DIAGNOSIS — R600 Localized edema: Secondary | ICD-10-CM | POA: Diagnosis not present

## 2024-06-14 DIAGNOSIS — M6281 Muscle weakness (generalized): Secondary | ICD-10-CM | POA: Diagnosis not present

## 2024-06-14 DIAGNOSIS — M25562 Pain in left knee: Secondary | ICD-10-CM

## 2024-06-14 DIAGNOSIS — M25561 Pain in right knee: Secondary | ICD-10-CM

## 2024-06-14 DIAGNOSIS — M25661 Stiffness of right knee, not elsewhere classified: Secondary | ICD-10-CM

## 2024-06-14 DIAGNOSIS — G8929 Other chronic pain: Secondary | ICD-10-CM

## 2024-06-16 ENCOUNTER — Encounter: Payer: Self-pay | Admitting: Rehabilitative and Restorative Service Providers"

## 2024-06-16 ENCOUNTER — Ambulatory Visit (INDEPENDENT_AMBULATORY_CARE_PROVIDER_SITE_OTHER): Admitting: Rehabilitative and Restorative Service Providers"

## 2024-06-16 DIAGNOSIS — M6281 Muscle weakness (generalized): Secondary | ICD-10-CM

## 2024-06-16 DIAGNOSIS — M25662 Stiffness of left knee, not elsewhere classified: Secondary | ICD-10-CM | POA: Diagnosis not present

## 2024-06-16 DIAGNOSIS — R262 Difficulty in walking, not elsewhere classified: Secondary | ICD-10-CM | POA: Diagnosis not present

## 2024-06-16 DIAGNOSIS — M25562 Pain in left knee: Secondary | ICD-10-CM

## 2024-06-16 DIAGNOSIS — M25661 Stiffness of right knee, not elsewhere classified: Secondary | ICD-10-CM

## 2024-06-16 DIAGNOSIS — R600 Localized edema: Secondary | ICD-10-CM | POA: Diagnosis not present

## 2024-06-16 DIAGNOSIS — G8929 Other chronic pain: Secondary | ICD-10-CM

## 2024-06-16 DIAGNOSIS — M25561 Pain in right knee: Secondary | ICD-10-CM

## 2024-06-16 NOTE — Therapy (Signed)
 OUTPATIENT PHYSICAL THERAPY LOWER EXTREMITY TREATMENT NOTE   Patient Name: Peter Russell MRN: [id number] DOB:03-15-1946, 78 y.o., male Today's Date: 06/16/2024   END OF SESSION:  PT End of Session - 06/16/24 1425     Visit Number 16    Number of Visits 28    Date for Recertification  07/12/24    Authorization Type MEDICARE/BCBS    PT Start Time 1348    PT Stop Time 1432    PT Time Calculation (min) 44 min    Activity Tolerance Patient tolerated treatment well;No increased pain;Patient limited by pain    Behavior During Therapy [facility] for tasks assessed/performed                  Past Medical History:  Diagnosis Date   Arthritis    sternum and knees   CKD (chronic kidney disease) stage 3, GFR 30-59 ml/min (HCC) 2020   Hypertension    Prostate cancer [facility])    Past Surgical History:  Procedure Laterality Date   APPENDECTOMY     cataract surgery Bilateral    06/2021, 08/2021   ESOPHAGEAL MANOMETRY N/A 04/08/2023   Procedure: ESOPHAGEAL MANOMETRY (EM);  Surgeon: Rollin Dover, MD;  Location: [facility];  Service: Gastroenterology;  Laterality: N/A;   NASAL SINUS SURGERY     PROSTATE CRYOABLATION     Patient Active Problem List   Diagnosis Date Noted   Impaired fasting blood sugar 05/23/2024   Elevated uric acid in blood 05/23/2024   Primary osteoarthritis of right knee 03/22/2024   Primary osteoarthritis of left knee 03/22/2024   Sensorineural hearing loss, bilateral 08/27/2023   Impacted cerumen of both ears 08/27/2023   Aortic atherosclerosis 08/25/2023   Achalasia 08/25/2023   History of prostate cancer 08/25/2023   Shortness of breath 05/24/2021   PVCs (premature ventricular contractions) 05/24/2021   Atypical chest pain 05/24/2021   Personal history of rheumatoid arthritis 02/15/2021   Malignant neoplasm metastatic to bone (HCC) 02/15/2021   Screen for colon cancer 02/15/2021   Vision impairment 02/15/2021   Dysphagia 02/15/2021   Abnormal abdominal  CT scan 02/15/2021   Screening for heart disease 02/02/2020   Vaccine counseling 02/02/2020   Chronic gout without tophus 02/02/2020   Advance directive discussed with patient 02/02/2020   Prostate cancer (HCC) 09/28/2019   Essential hypertension, benign 09/28/2019   Urine frequency 09/28/2019   OSA (obstructive sleep apnea) 09/28/2019   Vaccine refused by patient 09/28/2019   Medicare annual wellness visit, subsequent 09/28/2019    PCP: Alm RAMAN. Tysinger, PA-C  REFERRING PROVIDER: Kay CHRISTELLA Cummins, MD  REFERRING DIAG: M17.11 (ICD-10-CM) - Primary osteoarthritis of right knee M17.12 (ICD-10-CM) - Primary osteoarthritis of left knee  THERAPY DIAG:  Difficulty in walking, not elsewhere classified  Muscle weakness (generalized)  Localized edema  Stiffness of left knee, not elsewhere classified  Stiffness of right knee, not elsewhere classified  Chronic pain of both knees  Rationale for Evaluation and Treatment: Rehabilitation  ONSET DATE: A year ago  SUBJECTIVE:   SUBJECTIVE STATEMENT: Peter Russell reports excellent overall progress since starting supervised physical therapy.  He is starting to transition into a gym and would like a bit more strength work and professional feedback before completely cutting ties with physical therapy.  PERTINENT HISTORY: Bil knee OA, stage 3 CKD, HTN  Peter Russell notes right > left knee pain of at least a year duration.  He is trying Meloxicam .  He notes trouble descending stairs and hills, difficulty squatting.    PAIN:  Are you having pain? Yes: NPRS scale:  0-3/10 this week Pain location: Bilateral medial knee joints Pain description: Ache, stiff and sore Aggravating factors: Down his sloped driveway and ascending stairs Relieving factors: Meloxicam  (none in 2+ weeks), Change of position and gentle movement  PRECAUTIONS: None  RED FLAGS: None   WEIGHT BEARING RESTRICTIONS: No  FALLS:  Has patient fallen in last 6 months? 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  DIAGNOSTIC FINDINGS: IMPRESSION: 1. Severe degenerative changes of the RIGHT knee medial compartment. 2. Moderate degenerative changes of the LEFT knee medial compartment. 3. Subcortical cyst formation with possible patellar surface irregularity along the medial facet of the LEFT patella likely reflecting underlying cartilage deficiency/injury. 4. Small RIGHT knee joint effusion.  PATIENT SURVEYS:  PSFS: THE PATIENT SPECIFIC FUNCTIONAL SCALE  Place score of 0-10 (0 = unable to perform activity and 10 = able to perform activity at the same level as before injury or problem)  Activity Date: 04/07/2024 05/11/2024 05/26/2024  Ascending stairs 6 7 8   2.  Kneeling 2 2 2   3.  Descending stairs or a slope like his driveway 4 5 9   4.      Total Score 4 4.67 6.33    Total Score = Sum of activity scores/number of activities  Minimally Detectable Change: 3 points (for single activity); 2 points (for average score)  [name] (nd). The Patient Specific Functional Scale . Retrieved from Skateoasis.com.pt   COGNITION: Overall cognitive status: Within functional limits for tasks assessed     SENSATION: Infrequent foot and ankle tingling  EDEMA:  Noted and not objectively assessed   LOWER EXTREMITY ROM:  Active ROM Left/Right 04/07/2024   Hip flexion    Hip extension    Hip abduction    Hip adduction    Hip internal rotation    Hip external rotation    Knee flexion 132/126   Knee extension  3/2   Ankle dorsiflexion    Ankle plantarflexion    Ankle inversion    Ankle eversion     (Blank rows = not tested)  LOWER EXTREMITY STRENGTH:  Assessed in pounds with a hand-held dynamometer Left/Right 04/07/2024 Left/Right 05/04/2024 Left/Right 05/19/2024 Left/Right 05/31/24  Hip flexion      Hip extension      Hip abduction      Hip adduction      Hip internal rotation      Hip external rotation      Knee flexion      Knee extension 62.3/38.2 67.8/47.4 70.5/51.2 61.9/67.9  Ankle dorsiflexion      Ankle plantarflexion      Ankle inversion      Ankle eversion       (Blank rows = not tested)  GAIT: Distance walked: 100 feet Assistive device utilized: None Level of assistance: Complete Independence Comments: Peter Russell notes he has difficulty with too much weightbearing or with longer periods of sitting  TREATMENT DATE:  06/16/2024 Seated straight leg raises 10 for 3 seconds Seated knee extension machine 90-40 degrees (up both, down 1 leg only with slow eccentric contraction) 15# left and 10# right 15 reps each side (no rest needed)  Functional Activities: Single Leg Press 31# 15 x slow eccentrics  Step-up and over 4 and 8 inch step with slow eccentrics and 1 hand assist 10 x each Step-down off 4 and 6 inch step (as far as comfortable) 10 x slow eccentrics each side at each step height; using 1 upper extremity as needed to keep the strain on the quadriceps and off the knee joint   06/14/24 NuStep Level 5, Legs Only 6 minutes Seated straight leg raises 2 x 10 for 3 seconds 4#  Functional Activities: Hip hike in parallel bars (to avoid knee valgus) 2 sets of 10 for 3 seconds (lots of feedback and correction required) Sit to stand with 2# ball  2x8  Neuromuscular re-education: Tandem balance on foam eyes open 4 x each foot in front for 20 seconds Dynamic heel  to toe walk on foam, forward and back 4 laps   05/31/24 There ex Seated straight leg raises 2 sets of 10 for 3 seconds Rec bike 3 min Functional Activities: Single Leg Press 31# 15 x slow eccentrics  Hip hike in parallel bars (to avoid knee valgus) 2 sets of 10 for 3 seconds Step ups 4 inch Eccentric tap downs with 2 inch box   PATIENT EDUCATION:  Education details: See above Person educated: Patient Education method: Explanation, Demonstration, Tactile cues, Verbal cues, and Handouts Education comprehension: verbalized understanding, returned demonstration, verbal cues required, tactile cues required, and needs further education  HOME EXERCISE PROGRAM: Access Code: [email or web address] URL: [email or web address] Date: 06/16/2024 Prepared by: Lamar Russell  Exercises - Supine Quadricep Sets  - 3 x daily - 1 x weekly - 2 sets - 10 reps - 5 second hold - Small Range Straight Leg Raise  - 3 x daily - 3-7 x weekly - 2 sets - 5 reps - 3 seconds hold - Sit to Stand Without Arm Support  - 2 x daily - 3 x weekly - 1 sets - 5 reps - Seated Active Straight-Leg Raise  - 1 x daily - 1 x weekly - 2 sets - 5 reps - 3s hold - Standing Hip Hiking  - 1 x daily - 1 x weekly - 3 sets - 10 reps - 3 seconds hold - Tandem Stance  - 1 x daily - 3-5 x weekly - 1 sets - 5-10 reps - 20 second hold - Lateral Step Down  - 1 x daily - 3 x weekly - 2 sets - 10 reps  ASSESSMENT:  CLINICAL IMPRESSION: Peter Russell is preparing for transition into more independent rehabilitation.  He has joined a gym and is starting to participate in activities they are similar to what he is doing in the clinic.  Peter Russell will use the next few visits to get more comfortable with his home exercises and we will make appropriate progressions in preparation for transition into long-term independent rehabilitation.  OBJECTIVE IMPAIRMENTS: Abnormal gait, decreased activity tolerance, decreased endurance, decreased knowledge of condition,  difficulty walking, decreased ROM, decreased strength, increased edema, impaired perceived functional ability, and pain.   ACTIVITY LIMITATIONS: bending, sitting, standing, squatting, stairs, and locomotion level  PARTICIPATION LIMITATIONS: community activity and occupation  PERSONAL FACTORS: Bil knee OA, stage 3 CKD, HTN are also affecting patient's functional outcome.   REHAB POTENTIAL:  Good  CLINICAL DECISION MAKING: Evolving/moderate complexity  EVALUATION COMPLEXITY: Moderate   GOALS: Goals reviewed with patient? Yes  SHORT TERM GOALS: Target date: 05/05/2024 Peter Russell will be independent with his D1 home exercise program Baseline: Started 04/07/2024 Goal status: Met 05/13/2024  2.  Improve bilateral quadriceps strength to 75/50 pounds or better Baseline: 62.3/38.2 pounds respectively Goal status: Partially Met 05/31/24   LONG TERM GOALS: Target date: 07/12/2024 6weeks     Improve patient specific functional scale to at least 6 Baseline: 4 Goal status: Met 05/26/2024  2.  Peter Russell will report bilateral knee pain consistently 2-3/10 on the numeric pain rating scale Baseline: 2-5/10 Goal status: Met 05/19/2024  3.  Improve bilateral quadriceps strength to 80 pounds or greater Baseline: 62.3/38.2 pounds Goal status: Ongoing 05/31/24  4.  Peter Russell will be independent with his long-term maintenance home exercise program at discharge Baseline: Started 04/07/2024 Goal status:Ongoing 06/16/24 PLAN:  PT FREQUENCY:1-2x week  PT DURATION: 4 weeks  PLANNED INTERVENTIONS: 97750- Physical Performance Testing, 97110-Therapeutic exercises, 97530- Therapeutic activity, 97112- Neuromuscular re-education, 97535- Self Care, 02859- Manual therapy, [id number]- Gait training, [phone]- Electrical stimulation (unattended), [id number]- Vasopneumatic device, Patient/Family education, Balance training, Stair training, and Cryotherapy  PLAN FOR NEXT SESSION: Quadriceps strength, hip abductors strength and  balance progressions.  Work on ascending stairs and inclines.  Prepare for transition into independent and gym rehabilitation.  Peter Russell PT MPT 06/16/24  5:43 PM

## 2024-06-28 ENCOUNTER — Encounter: Payer: Self-pay | Admitting: Rehabilitative and Restorative Service Providers"

## 2024-06-28 ENCOUNTER — Ambulatory Visit (INDEPENDENT_AMBULATORY_CARE_PROVIDER_SITE_OTHER): Admitting: Rehabilitative and Restorative Service Providers"

## 2024-06-28 DIAGNOSIS — M25561 Pain in right knee: Secondary | ICD-10-CM

## 2024-06-28 DIAGNOSIS — M25562 Pain in left knee: Secondary | ICD-10-CM

## 2024-06-28 DIAGNOSIS — G8929 Other chronic pain: Secondary | ICD-10-CM

## 2024-06-28 DIAGNOSIS — R600 Localized edema: Secondary | ICD-10-CM | POA: Diagnosis not present

## 2024-06-28 DIAGNOSIS — R262 Difficulty in walking, not elsewhere classified: Secondary | ICD-10-CM | POA: Diagnosis not present

## 2024-06-28 DIAGNOSIS — M25661 Stiffness of right knee, not elsewhere classified: Secondary | ICD-10-CM

## 2024-06-28 DIAGNOSIS — M6281 Muscle weakness (generalized): Secondary | ICD-10-CM | POA: Diagnosis not present

## 2024-06-28 DIAGNOSIS — M25662 Stiffness of left knee, not elsewhere classified: Secondary | ICD-10-CM

## 2024-06-28 NOTE — Therapy (Signed)
 OUTPATIENT PHYSICAL THERAPY LOWER EXTREMITY TREATMENT NOTE   Patient Name: Peter Russell MRN: [id number] DOB:[birth year], 78 y.o., male Today's Date: 06/28/2024   END OF SESSION:  PT End of Session - 06/28/24 1259     Visit Number 17    Number of Visits 28    Date for Recertification  07/12/24    Authorization Type MEDICARE/BCBS    PT Start Time 1258    PT Stop Time 1339    PT Time Calculation (min) 41 min    Activity Tolerance Patient tolerated treatment well;No increased pain    Behavior During Therapy WFL for tasks assessed/performed                   Past Medical History:  Diagnosis Date   Arthritis    sternum and knees   CKD (chronic kidney disease) stage 3, GFR 30-59 ml/min (HCC) 2020   Hypertension    Prostate cancer [facility])    Past Surgical History:  Procedure Laterality Date   APPENDECTOMY     cataract surgery Bilateral    06/2021, 08/2021   ESOPHAGEAL MANOMETRY N/A 04/08/2023   Procedure: ESOPHAGEAL MANOMETRY (EM);  Surgeon: Rollin Dover, MD;  Location: [facility];  Service: Gastroenterology;  Laterality: N/A;   NASAL SINUS SURGERY     PROSTATE CRYOABLATION     Patient Active Problem List   Diagnosis Date Noted   Impaired fasting blood sugar 05/23/2024   Elevated uric acid in blood 05/23/2024   Primary osteoarthritis of right knee 03/22/2024   Primary osteoarthritis of left knee 03/22/2024   Sensorineural hearing loss, bilateral 08/27/2023   Impacted cerumen of both ears 08/27/2023   Aortic atherosclerosis 08/25/2023   Achalasia 08/25/2023   History of prostate cancer 08/25/2023   Shortness of breath 05/24/2021   PVCs (premature ventricular contractions) 05/24/2021   Atypical chest pain 05/24/2021   Personal history of rheumatoid arthritis 02/15/2021   Malignant neoplasm metastatic to bone (HCC) 02/15/2021   Screen for colon cancer 02/15/2021   Vision impairment 02/15/2021   Dysphagia 02/15/2021   Abnormal abdominal CT scan 02/15/2021    Screening for heart disease 02/02/2020   Vaccine counseling 02/02/2020   Chronic gout without tophus 02/02/2020   Advance directive discussed with patient 02/02/2020   Prostate cancer (HCC) 09/28/2019   Essential hypertension, benign 09/28/2019   Urine frequency 09/28/2019   OSA (obstructive sleep apnea) 09/28/2019   Vaccine refused by patient 09/28/2019   Medicare annual wellness visit, subsequent 09/28/2019    PCP: Alm RAMAN. Tysinger, PA-C  REFERRING PROVIDER: Kay CHRISTELLA Cummins, MD  REFERRING DIAG: M17.11 (ICD-10-CM) - Primary osteoarthritis of right knee M17.12 (ICD-10-CM) - Primary osteoarthritis of left knee  THERAPY DIAG:  Difficulty in walking, not elsewhere classified  Muscle weakness (generalized)  Localized edema  Stiffness of left knee, not elsewhere classified  Stiffness of right knee, not elsewhere classified  Chronic pain of both knees  Rationale for Evaluation and Treatment: Rehabilitation  ONSET DATE: A year ago  SUBJECTIVE:   SUBJECTIVE STATEMENT: Peter Russell reports no need for Meloxicam  or other pain meds since his last visit.  He is getting more comfortable with his HEP and appears to be on track for DC within the current plan of care.  PERTINENT HISTORY: Bil knee OA, stage 3 CKD, HTN  Peter Russell notes right > left knee pain of at least a year duration.  He is trying Meloxicam .  He notes trouble descending stairs and hills, difficulty squatting.    PAIN:  Are  you having pain? Yes: NPRS scale:  Has remained 0-3/10 since his last visit Pain location: Bilateral medial knee joints right > left Pain description: Ache, stiff and sore Aggravating factors: Down his sloped driveway, in & out of the car and ascending stairs Relieving factors: Meloxicam  (none in several weeks), Change of position and gentle movement  PRECAUTIONS: None  RED FLAGS: None   WEIGHT BEARING RESTRICTIONS: No  FALLS:  Has patient fallen in last 6 months? 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  DIAGNOSTIC FINDINGS: IMPRESSION: 1. Severe degenerative changes of the RIGHT knee medial compartment. 2. Moderate degenerative changes of the LEFT knee medial compartment. 3. Subcortical cyst formation with possible patellar surface irregularity along the medial facet of the LEFT patella likely reflecting underlying cartilage deficiency/injury. 4. Small RIGHT knee joint effusion.  PATIENT SURVEYS:  PSFS: THE PATIENT SPECIFIC FUNCTIONAL SCALE  Place score of 0-10 (0 = unable to perform activity and 10 = able to perform activity at the same level as before injury or problem)  Activity Date: 04/07/2024 05/11/2024 05/26/2024  Ascending stairs 6 7 8   2.  Kneeling 2 2 2   3.  Descending stairs or a slope like his driveway 4 5 9   4.      Total Score 4 4.67 6.33    Total Score = Sum of activity scores/number of activities  Minimally Detectable Change: 3 points (for single activity); 2 points (for average score)  [facility] (nd). The Patient Specific Functional Scale . Retrieved from Skateoasis.com.pt   COGNITION: Overall cognitive status: Within functional limits for tasks assessed     SENSATION: Infrequent foot and ankle tingling  EDEMA:  Noted and not objectively assessed   LOWER EXTREMITY ROM:  Active ROM Left/Right 04/07/2024   Hip flexion    Hip extension    Hip abduction    Hip adduction    Hip internal rotation    Hip external rotation    Knee flexion 132/126   Knee extension 3/2   Ankle  dorsiflexion    Ankle plantarflexion    Ankle inversion    Ankle eversion     (Blank rows = not tested)  LOWER EXTREMITY STRENGTH:  Assessed in pounds with a hand-held dynamometer Left/Right 04/07/2024 Left/Right 05/04/2024 Left/Right 05/19/2024 Left/Right 05/31/24  Hip flexion      Hip extension      Hip abduction      Hip adduction      Hip internal rotation      Hip external rotation      Knee flexion      Knee extension 62.3/38.2 67.8/47.4 70.5/51.2 61.9/67.9  Ankle dorsiflexion      Ankle plantarflexion      Ankle inversion      Ankle eversion       (Blank rows = not tested)  GAIT: Distance walked: 100 feet Assistive device utilized: None Level of assistance: Complete Independence Comments: Jahzir notes he has difficulty with too much weightbearing or with longer periods of sitting  TREATMENT DATE:  06/28/2024 Seated straight leg raises 2 sets of 10 for 3 seconds for 1# Seated knee extension machine 90-40 degrees (up both, down 1 leg only with slow eccentric contraction) 15# left and 10# right 15 reps each side (no rest needed and maybe go a bit heavier with both next visit)  Functional Activities: Double Leg Press 75# 15 x slow eccentrics Single Leg Press 31# 15 x slow eccentrics  Step-up and over 6 and 8 inch step with slow eccentrics and 1 hand assist 10 x each Step-down off 4 and 6 inch step (as far as comfortable) 10 x slow eccentrics each side at each step height; using 1 upper extremity as needed to keep the strain on the quadriceps and off the knee joint (easier on the left side)   06/16/2024 NuStep Resistance level 6 for 5 minutes Seated straight leg raises 10 for 3 seconds Seated knee extension machine 90-40 degrees (up both, down 1 leg only with slow eccentric contraction) 15# left and 10# right 15 reps each side (no rest needed)  Functional  Activities: Single Leg Press 31# 15 x slow eccentrics  Step-up and over 4 and 8 inch step with slow eccentrics and 1 hand assist 10 x each Step-down off 4 and 6 inch step (as far as comfortable) 10 x slow eccentrics each side at each step height; using 1 upper extremity as needed to keep the strain on the quadriceps and off the knee joint   06/14/24 NuStep Level 5, Legs Only 6 minutes Seated straight leg raises 2 x 10 for 3 seconds 4#  Functional Activities: Hip hike in parallel bars (to avoid knee valgus) 2 sets of 10 for 3 seconds (lots of feedback and correction required) Sit to stand with 2# ball  2x8  Neuromuscular re-education: Tandem balance on foam eyes open 4 x each foot in front for 20 seconds Dynamic heel to toe walk on foam, forward and back 4 laps  PATIENT EDUCATION:  Education details: See above Person educated: Patient Education method: Explanation, Demonstration, Tactile cues, Verbal cues, and Handouts Education comprehension: verbalized understanding, returned demonstration, verbal cues required, tactile cues required, and needs further education  HOME EXERCISE PROGRAM: Access Code: [email or web address] URL: [email or web address] Date: 06/16/2024 Prepared by: Lamar Ivory  Exercises - Supine Quadricep Sets  - 3 x daily - 1 x weekly - 2 sets - 10 reps - 5 second hold - Small Range Straight Leg Raise  - 3 x daily - 3-7 x weekly - 2 sets - 5 reps - 3 seconds hold - Sit to Stand Without Arm Support  - 2 x daily - 3 x weekly - 1 sets - 5 reps - Seated Active Straight-Leg Raise  - 1 x daily - 1 x weekly - 2 sets - 5 reps - 3s hold - Standing Hip Hiking  - 1 x daily - 1 x weekly - 3 sets - 10 reps - 3 seconds hold - Tandem Stance  - 1 x daily - 3-5 x weekly - 1 sets - 5-10 reps - 20 second hold - Lateral Step Down  - 1 x daily - 3 x weekly - 2 sets - 10 reps  ASSESSMENT:  CLINICAL IMPRESSION: Allie continues to prepare for transition into independent  rehabilitation.  He has been encouraged to start at the gym (joined, but not started).  Berdell also wants to practice getting in and out of the car along with using the next few visits to get more  comfortable with his home exercises before transition into long-term independent rehabilitation.  OBJECTIVE IMPAIRMENTS: Abnormal gait, decreased activity tolerance, decreased endurance, decreased knowledge of condition, difficulty walking, decreased ROM, decreased strength, increased edema, impaired perceived functional ability, and pain.   ACTIVITY LIMITATIONS: bending, sitting, standing, squatting, stairs, and locomotion level  PARTICIPATION LIMITATIONS: community activity and occupation  PERSONAL FACTORS: Bil knee OA, stage 3 CKD, HTN are also affecting patient's functional outcome.   REHAB POTENTIAL: Good  CLINICAL DECISION MAKING: Evolving/moderate complexity  EVALUATION COMPLEXITY: Moderate   GOALS: Goals reviewed with patient? Yes  SHORT TERM GOALS: Target date: 05/05/2024 Yonathan will be independent with his D1 home exercise program Baseline: Started 04/07/2024 Goal status: Met 05/13/2024  2.  Improve bilateral quadriceps strength to 75/50 pounds or better Baseline: 62.3/38.2 pounds respectively Goal status: Partially Met 05/31/24   LONG TERM GOALS: Target date: 07/12/2024 6weeks     Improve patient specific functional scale to at least 6 Baseline: 4 Goal status: Met 05/26/2024  2.  Chadwick will report bilateral knee pain consistently 2-3/10 on the numeric pain rating scale Baseline: 2-5/10 Goal status: Met 05/19/2024  3.  Improve bilateral quadriceps strength to 80 pounds or greater Baseline: 62.3/38.2 pounds Goal status: Ongoing 05/31/24  4.  Tyce will be independent with his long-term maintenance home exercise program at discharge Baseline: Started 04/07/2024 Goal status:Ongoing 06/28/24 PLAN:  PT FREQUENCY:1-2x week  PT DURATION: 2-3 weeks  PLANNED INTERVENTIONS:  97750- Physical Performance Testing, 97110-Therapeutic exercises, 97530- Therapeutic activity, 97112- Neuromuscular re-education, 97535- Self Care, [id number]- Manual therapy, [id number]- Gait training, [phone]- Electrical stimulation (unattended), [id number]- Vasopneumatic device, Patient/Family education, Balance training, Stair training, and Cryotherapy  PLAN FOR NEXT SESSION: Quadriceps strength, hip abductors strength and balance progressions.  Work on ascending stairs and inclines.  Prepare for transition into independent and gym rehabilitation.  He wants to work on transitioning in and out of the car.  Myer MICAEL Ivory PT MPT 06/28/24  1:43 PM

## 2024-06-30 NOTE — Therapy (Signed)
 OUTPATIENT PHYSICAL THERAPY LOWER EXTREMITY TREATMENT NOTE   Patient Name: Peter Russell MRN: [id number] DOB:[birth year], 78 y.o., male Today's Date: 07/01/2024   END OF SESSION:  PT End of Session - 07/01/24 1313     Visit Number 18    Number of Visits 28    Date for Recertification  07/12/24    Authorization Type MEDICARE/BCBS    PT Start Time 1300    PT Stop Time 1339    PT Time Calculation (min) 39 min    Activity Tolerance Patient tolerated treatment well    Behavior During Therapy WFL for tasks assessed/performed                    Past Medical History:  Diagnosis Date   Arthritis    sternum and knees   CKD (chronic kidney disease) stage 3, GFR 30-59 ml/min (HCC) 2020   Hypertension    Prostate cancer [facility])    Past Surgical History:  Procedure Laterality Date   APPENDECTOMY     cataract surgery Bilateral    06/2021, 08/2021   ESOPHAGEAL MANOMETRY N/A 04/08/2023   Procedure: ESOPHAGEAL MANOMETRY (EM);  Surgeon: Rollin Dover, MD;  Location: [facility];  Service: Gastroenterology;  Laterality: N/A;   NASAL SINUS SURGERY     PROSTATE CRYOABLATION     Patient Active Problem List   Diagnosis Date Noted   Impaired fasting blood sugar 05/23/2024   Elevated uric acid in blood 05/23/2024   Primary osteoarthritis of right knee 03/22/2024   Primary osteoarthritis of left knee 03/22/2024   Sensorineural hearing loss, bilateral 08/27/2023   Impacted cerumen of both ears 08/27/2023   Aortic atherosclerosis 08/25/2023   Achalasia 08/25/2023   History of prostate cancer 08/25/2023   Shortness of breath 05/24/2021   PVCs (premature ventricular contractions) 05/24/2021   Atypical chest pain 05/24/2021   Personal history of rheumatoid arthritis 02/15/2021   Malignant neoplasm metastatic to bone (HCC) 02/15/2021   Screen for colon cancer 02/15/2021   Vision impairment 02/15/2021   Dysphagia 02/15/2021   Abnormal abdominal CT scan 02/15/2021   Screening for  heart disease 02/02/2020   Vaccine counseling 02/02/2020   Chronic gout without tophus 02/02/2020   Advance directive discussed with patient 02/02/2020   Prostate cancer (HCC) 09/28/2019   Essential hypertension, benign 09/28/2019   Urine frequency 09/28/2019   OSA (obstructive sleep apnea) 09/28/2019   Vaccine refused by patient 09/28/2019   Medicare annual wellness visit, subsequent 09/28/2019    PCP: Alm RAMAN. Tysinger, PA-C  REFERRING PROVIDER: Kay CHRISTELLA Cummins, MD  REFERRING DIAG: M17.11 (ICD-10-CM) - Primary osteoarthritis of right knee M17.12 (ICD-10-CM) - Primary osteoarthritis of left knee  THERAPY DIAG:  Difficulty in walking, not elsewhere classified  Muscle weakness (generalized)  Localized edema  Stiffness of left knee, not elsewhere classified  Stiffness of right knee, not elsewhere classified  Chronic pain of both knees  Rationale for Evaluation and Treatment: Rehabilitation  ONSET DATE: A year ago  SUBJECTIVE:   SUBJECTIVE STATEMENT: Terelle reports general walking is pain free.  Still challenged by transfers like in/out of car. PERTINENT HISTORY: Bil knee OA, stage 3 CKD, HTN  Vanderbilt notes right > left knee pain of at least a year duration.  He is trying Meloxicam .  He notes trouble descending stairs and hills, difficulty squatting.    PAIN:  Are you having pain? Yes: NPRS scale: 0/10>2/10 Pain location: Bilateral medial knee joints right > left Pain description: Ache, stiff and sore Aggravating  factors: Down his sloped driveway, in & out of the car and ascending stairs Relieving factors: Meloxicam  (none in several weeks), Change of position and gentle movement  PRECAUTIONS: None  RED FLAGS: None   WEIGHT BEARING RESTRICTIONS: No  FALLS:  Has patient fallen in last 6 months? 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  DIAGNOSTIC FINDINGS: IMPRESSION: 1. Severe degenerative changes of the RIGHT knee medial compartment. 2. Moderate degenerative changes of the LEFT knee medial compartment. 3. Subcortical cyst formation with possible patellar surface irregularity along the medial facet of the LEFT patella likely reflecting underlying cartilage deficiency/injury. 4. Small RIGHT knee joint effusion.  PATIENT SURVEYS:  PSFS: THE PATIENT SPECIFIC FUNCTIONAL SCALE  Place score of 0-10 (0 = unable to perform activity and 10 = able to perform activity at the same level as before injury or problem)  Activity Date: 04/07/2024 05/11/2024 05/26/2024  Ascending stairs 6 7 8   2.  Kneeling 2 2 2   3.  Descending stairs or a slope like his driveway 4 5 9   4.      Total Score 4 4.67 6.33    Total Score = Sum of activity scores/number of activities  Minimally Detectable Change: 3 points (for single activity); 2 points (for average score)  [name] (nd). The Patient Specific Functional Scale . Retrieved from Skateoasis.com.pt   COGNITION: Overall cognitive status: Within functional limits for tasks assessed     SENSATION: Infrequent foot and ankle tingling  EDEMA:  Noted and not objectively assessed   LOWER EXTREMITY ROM:  Active ROM Left/Right 04/07/2024   Hip flexion    Hip extension    Hip abduction    Hip adduction    Hip internal rotation    Hip external rotation    Knee flexion 132/126   Knee extension 3/2   Ankle dorsiflexion    Ankle plantarflexion    Ankle inversion    Ankle eversion     (Blank rows = not tested)  LOWER EXTREMITY STRENGTH:  Assessed  in pounds with a hand-held dynamometer Left/Right 04/07/2024 Left/Right 05/04/2024 Left/Right 05/19/2024 Left/Right 05/31/24  Hip flexion      Hip extension      Hip abduction      Hip adduction      Hip internal rotation      Hip external rotation      Knee flexion      Knee extension 62.3/38.2 67.8/47.4 70.5/51.2 61.9/67.9  Ankle dorsiflexion      Ankle plantarflexion      Ankle inversion      Ankle eversion       (Blank rows = not tested)  GAIT: Distance walked: 100 feet Assistive device utilized: None Level of assistance: Complete Independence Comments: Jadyn notes he has difficulty with too much weightbearing or with longer periods of sitting  TREATMENT DATE:  07/01/24 Nustep level 5 6 min Seated knee extension machine 90-40 degrees (up both, down 1 leg only with slow eccentric contraction) 15# 10x each leg then 20# 8x each leg Incline board stretch 25 sec x3 Functional Activities: Double Leg Press 75# 15 x slow eccentrics Single Leg Press 31# 15 x slow eccentrics each leg  06/28/2024 Seated straight leg raises 2 sets of 10 for 3 seconds for 1# Seated knee extension machine 90-40 degrees (up both, down 1 leg only with slow eccentric contraction) 15# left and 10# right 15 reps each side (no rest needed and maybe go a bit heavier with both next visit)  Functional Activities: Double Leg Press 75# 15 x slow eccentrics Single Leg Press 31# 15 x slow eccentrics  Step-up and over 6 and 8 inch step with slow eccentrics and 1 hand assist 10 x each Step-down off 4 and 6 inch step (as far as comfortable) 10 x slow eccentrics each side at each step height; using 1 upper extremity as needed to keep the strain on the quadriceps and off the knee joint (easier on the left side)   06/16/2024 NuStep Resistance level 6 for 5 minutes Seated straight leg raises 10 for 3  seconds Seated knee extension machine 90-40 degrees (up both, down 1 leg only with slow eccentric contraction) 15# left and 10# right 15 reps each side (no rest needed)  Functional Activities: Single Leg Press 31# 15 x slow eccentrics  Step-up and over 4 and 8 inch step with slow eccentrics and 1 hand assist 10 x each Step-down off 4 and 6 inch step (as far as comfortable) 10 x slow eccentrics each side at each step height; using 1 upper extremity as needed to keep the strain on the quadriceps and off the knee joint   06/14/24 NuStep Level 5, Legs Only 6 minutes Seated straight leg raises 2 x 10 for 3 seconds 4#  Functional Activities: Hip hike in parallel bars (to avoid knee valgus) 2 sets of 10 for 3 seconds (lots of feedback and correction required) Sit to stand with 2# ball  2x8  Neuromuscular re-education: Tandem balance on foam eyes open 4 x each foot in front for 20 seconds Dynamic heel to toe walk on foam, forward and back 4 laps  PATIENT EDUCATION:  Education details: See above Person educated: Patient Education method: Explanation, Demonstration, Tactile cues, Verbal cues, and Handouts Education comprehension: verbalized understanding, returned demonstration, verbal cues required, tactile cues required, and needs further education  HOME EXERCISE PROGRAM: Access Code: [email or web address] URL: [email or web address] Date: 06/16/2024 Prepared by: Lamar Ivory  Exercises - Supine Quadricep Sets  - 3 x daily - 1 x weekly - 2 sets - 10 reps - 5 second hold - Small Range Straight Leg Raise  - 3 x daily - 3-7 x weekly - 2 sets - 5 reps - 3 seconds hold - Sit to Stand Without Arm Support  - 2 x daily - 3 x weekly - 1 sets - 5 reps - Seated Active Straight-Leg Raise  - 1 x daily - 1 x weekly - 2 sets - 5 reps - 3s hold - Standing Hip Hiking  - 1 x daily - 1 x weekly - 3 sets - 10 reps - 3 seconds hold - Tandem Stance  - 1 x daily - 3-5 x weekly - 1 sets - 5-10 reps - 20  second hold - Lateral Step Down  - 1 x daily - 3 x  weekly - 2 sets - 10 reps  ASSESSMENT:  CLINICAL IMPRESSION: Improved ability to increase strength/resistance and perform full set. OBJECTIVE IMPAIRMENTS: Abnormal gait, decreased activity tolerance, decreased endurance, decreased knowledge of condition, difficulty walking, decreased ROM, decreased strength, increased edema, impaired perceived functional ability, and pain.   ACTIVITY LIMITATIONS: bending, sitting, standing, squatting, stairs, and locomotion level  PARTICIPATION LIMITATIONS: community activity and occupation  PERSONAL FACTORS: Bil knee OA, stage 3 CKD, HTN are also affecting patient's functional outcome.   REHAB POTENTIAL: Good  CLINICAL DECISION MAKING: Evolving/moderate complexity  EVALUATION COMPLEXITY: Moderate   GOALS: Goals reviewed with patient? Yes  SHORT TERM GOALS: Target date: 05/05/2024 Ramell will be independent with his D1 home exercise program Baseline: Started 04/07/2024 Goal status: Met 05/13/2024  2.  Improve bilateral quadriceps strength to 75/50 pounds or better Baseline: 62.3/38.2 pounds respectively Goal status: Partially Met 05/31/24   LONG TERM GOALS: Target date: 07/12/2024 6weeks     Improve patient specific functional scale to at least 6 Baseline: 4 Goal status: Met 05/26/2024  2.  Earlie will report bilateral knee pain consistently 2-3/10 on the numeric pain rating scale Baseline: 2-5/10 Goal status: Met 05/19/2024  3.  Improve bilateral quadriceps strength to 80 pounds or greater Baseline: 62.3/38.2 pounds Goal status: Ongoing 05/31/24  4.  Klyde will be independent with his long-term maintenance home exercise program at discharge Baseline: Started 04/07/2024 Goal status:Ongoing 06/28/24 PLAN:  PT FREQUENCY:1-2x week  PT DURATION: 2-3 weeks  PLANNED INTERVENTIONS: 97750- Physical Performance Testing, 97110-Therapeutic exercises, 97530- Therapeutic activity, 97112-  Neuromuscular re-education, 97535- Self Care, 02859- Manual therapy, [phone]- Gait training, [phone]- Electrical stimulation (unattended), 97016- Vasopneumatic device, Patient/Family education, Balance training, Stair training, and Cryotherapy  PLAN FOR NEXT SESSION: Reviewed machine wmechanism and functional sit to stand for OOC.  Continue with quadriceps strength, hip abductors strength and balance progressions.  Work on ascending stairs, inclines and transfers out of car.     Burnard Meth, PT 07/01/24  1:41 PM

## 2024-07-01 ENCOUNTER — Ambulatory Visit (INDEPENDENT_AMBULATORY_CARE_PROVIDER_SITE_OTHER)

## 2024-07-01 DIAGNOSIS — M25662 Stiffness of left knee, not elsewhere classified: Secondary | ICD-10-CM | POA: Diagnosis not present

## 2024-07-01 DIAGNOSIS — M6281 Muscle weakness (generalized): Secondary | ICD-10-CM | POA: Diagnosis not present

## 2024-07-01 DIAGNOSIS — R600 Localized edema: Secondary | ICD-10-CM | POA: Diagnosis not present

## 2024-07-01 DIAGNOSIS — R262 Difficulty in walking, not elsewhere classified: Secondary | ICD-10-CM

## 2024-07-01 DIAGNOSIS — M25561 Pain in right knee: Secondary | ICD-10-CM

## 2024-07-01 DIAGNOSIS — M25562 Pain in left knee: Secondary | ICD-10-CM

## 2024-07-01 DIAGNOSIS — G8929 Other chronic pain: Secondary | ICD-10-CM

## 2024-07-01 DIAGNOSIS — M25661 Stiffness of right knee, not elsewhere classified: Secondary | ICD-10-CM

## 2024-07-05 ENCOUNTER — Encounter: Payer: Self-pay | Admitting: Rehabilitative and Restorative Service Providers"

## 2024-07-05 ENCOUNTER — Ambulatory Visit: Admitting: Rehabilitative and Restorative Service Providers"

## 2024-07-05 DIAGNOSIS — M6281 Muscle weakness (generalized): Secondary | ICD-10-CM

## 2024-07-05 DIAGNOSIS — R600 Localized edema: Secondary | ICD-10-CM

## 2024-07-05 DIAGNOSIS — M25661 Stiffness of right knee, not elsewhere classified: Secondary | ICD-10-CM

## 2024-07-05 DIAGNOSIS — M25662 Stiffness of left knee, not elsewhere classified: Secondary | ICD-10-CM | POA: Diagnosis not present

## 2024-07-05 DIAGNOSIS — R262 Difficulty in walking, not elsewhere classified: Secondary | ICD-10-CM | POA: Diagnosis not present

## 2024-07-05 DIAGNOSIS — G8929 Other chronic pain: Secondary | ICD-10-CM

## 2024-07-05 DIAGNOSIS — M25561 Pain in right knee: Secondary | ICD-10-CM

## 2024-07-05 DIAGNOSIS — M25562 Pain in left knee: Secondary | ICD-10-CM

## 2024-07-05 NOTE — Therapy (Signed)
 OUTPATIENT PHYSICAL THERAPY LOWER EXTREMITY TREATMENT NOTE   Patient Name: Peter Russell MRN: [id number] DOB:05-02-1946, 78 y.o., male Today's Date: 07/05/2024   END OF SESSION:  PT End of Session - 07/05/24 1350     Visit Number 19    Number of Visits 28    Date for Recertification  07/12/24    Authorization Type MEDICARE/BCBS    PT Start Time 1350    PT Stop Time 1430    PT Time Calculation (min) 40 min    Activity Tolerance Patient tolerated treatment well;No increased pain;Patient limited by pain    Behavior During Therapy [facility] for tasks assessed/performed                    Past Medical History:  Diagnosis Date   Arthritis    sternum and knees   CKD (chronic kidney disease) stage 3, GFR 30-59 ml/min (HCC) 2020   Hypertension    Prostate cancer [facility])    Past Surgical History:  Procedure Laterality Date   APPENDECTOMY     cataract surgery Bilateral    06/2021, 08/2021   ESOPHAGEAL MANOMETRY N/A 04/08/2023   Procedure: ESOPHAGEAL MANOMETRY (EM);  Surgeon: Rollin Dover, MD;  Location: [facility];  Service: Gastroenterology;  Laterality: N/A;   NASAL SINUS SURGERY     PROSTATE CRYOABLATION     Patient Active Problem List   Diagnosis Date Noted   Impaired fasting blood sugar 05/23/2024   Elevated uric acid in blood 05/23/2024   Primary osteoarthritis of right knee 03/22/2024   Primary osteoarthritis of left knee 03/22/2024   Sensorineural hearing loss, bilateral 08/27/2023   Impacted cerumen of both ears 08/27/2023   Aortic atherosclerosis 08/25/2023   Achalasia 08/25/2023   History of prostate cancer 08/25/2023   Shortness of breath 05/24/2021   PVCs (premature ventricular contractions) 05/24/2021   Atypical chest pain 05/24/2021   Personal history of rheumatoid arthritis 02/15/2021   Malignant neoplasm metastatic to bone (HCC) 02/15/2021   Screen for colon cancer 02/15/2021   Vision impairment 02/15/2021   Dysphagia 02/15/2021   Abnormal  abdominal CT scan 02/15/2021   Screening for heart disease 02/02/2020   Vaccine counseling 02/02/2020   Chronic gout without tophus 02/02/2020   Advance directive discussed with patient 02/02/2020   Prostate cancer (HCC) 09/28/2019   Essential hypertension, benign 09/28/2019   Urine frequency 09/28/2019   OSA (obstructive sleep apnea) 09/28/2019   Vaccine refused by patient 09/28/2019   Medicare annual wellness visit, subsequent 09/28/2019    PCP: Alm RAMAN. Tysinger, PA-C  REFERRING PROVIDER: Kay CHRISTELLA Cummins, MD  REFERRING DIAG: M17.11 (ICD-10-CM) - Primary osteoarthritis of right knee M17.12 (ICD-10-CM) - Primary osteoarthritis of left knee  THERAPY DIAG:  Difficulty in walking, not elsewhere classified  Muscle weakness (generalized)  Localized edema  Stiffness of left knee, not elsewhere classified  Stiffness of right knee, not elsewhere classified  Chronic pain of both knees  Rationale for Evaluation and Treatment: Rehabilitation  ONSET DATE: A year ago  SUBJECTIVE:   SUBJECTIVE STATEMENT: Peter Russell reports he is maintaining 2-3 x per week with his HEP.  Getting out of the car and descending ramps are still challenging.  PERTINENT HISTORY: Bil knee OA, stage 3 CKD, HTN  Peter Russell notes right > left knee pain of at least a year duration.  He is trying Meloxicam .  He notes trouble descending stairs and hills, difficulty squatting.    PAIN:  Are you having pain? Yes: NPRS scale: 0-3/10 this week  Pain location: Bilateral medial knee joints right > left Pain description: Ache, stiff and sore Aggravating factors: Down his sloped driveway, in & out of the car Relieving factors: Meloxicam  (none in a month), Change of position and gentle movement  PRECAUTIONS: None  RED FLAGS: None   WEIGHT BEARING RESTRICTIONS: No  FALLS:  Has patient fallen in last 6 months? 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  DIAGNOSTIC FINDINGS: IMPRESSION: 1. Severe degenerative changes of the RIGHT knee medial compartment. 2. Moderate degenerative changes of the LEFT knee medial compartment. 3. Subcortical cyst formation with possible patellar surface irregularity along the medial facet of the LEFT patella likely reflecting underlying cartilage deficiency/injury. 4. Small RIGHT knee joint effusion.  PATIENT SURVEYS:  PSFS: THE PATIENT SPECIFIC FUNCTIONAL SCALE  Place score of 0-10 (0 = unable to perform activity and 10 = able to perform activity at the same level as before injury or problem)  Activity Date: 04/07/2024 05/11/2024 05/26/2024 07/05/2024  Ascending stairs 6 7 8 8   2.  Kneeling 2 2 2 2   3.  Descending stairs or a slope like his driveway 4 5 9 9   4.       Total Score 4 4.67 6.33 6.33    Total Score = Sum of activity scores/number of activities  Minimally Detectable Change: 3 points (for single activity); 2 points (for average score)  [facility] (nd). The Patient Specific Functional Scale . Retrieved from [email or web address].[email or web address]   COGNITION: Overall cognitive status: Within functional limits for tasks assessed     SENSATION: Infrequent foot and ankle tingling  EDEMA:  Noted and not objectively assessed   LOWER EXTREMITY ROM:  Active ROM Left/Right 04/07/2024   Hip flexion    Hip extension    Hip abduction    Hip adduction    Hip internal rotation    Hip external rotation    Knee flexion 132/126   Knee extension 3/2   Ankle dorsiflexion    Ankle plantarflexion     Ankle inversion    Ankle eversion     (Blank rows = not tested)  LOWER EXTREMITY STRENGTH:  Assessed in pounds with a hand-held dynamometer Left/Right 04/07/2024 Left/Right 05/04/2024 Left/Right 05/19/2024 Left/Right 05/31/24  Hip flexion      Hip extension      Hip abduction      Hip adduction      Hip internal rotation      Hip external rotation      Knee flexion      Knee extension 62.3/38.2 67.8/47.4 70.5/51.2 61.9/67.9  Ankle dorsiflexion      Ankle plantarflexion      Ankle inversion      Ankle eversion       (Blank rows = not tested)  GAIT: Distance walked: 100 feet Assistive device utilized: None Level of assistance: Complete Independence Comments: Baylin notes he has difficulty with too much weightbearing or with longer periods of sitting  TREATMENT DATE:  07/05/2024 NuStep Level 5 for 8 minutes Seated straight leg raises 2 sets of 10 for 3 seconds for 1# Seated knee extension machine 90-40 degrees (up both, down 1 leg only with slow eccentric contraction) 20# left and 20# right 10 reps each side (use the other foot a bit at the end range to keep strain on the quads and not knees)  Functional Activities: Double Leg Press 81# 10 x slow eccentrics Single Leg Press 31# 15 x slow eccentrics  Step-up and over 8 inch step with slow eccentrics and 1 hand assist 10 x each side leading Step-down off 4 inch step (as far as comfortable) 2 sets 10 x slow eccentrics each side at each step height; using 1 upper extremity as needed to keep the strain on the quadriceps and off the knee joint (still easier on the left side)   07/01/24 Nustep level 5 6 min Seated knee extension machine 90-40 degrees (up both, down 1 leg only with slow eccentric contraction) 15# 10x each leg then 20# 8x each leg Incline board stretch 25 sec x3 Functional Activities: Double Leg  Press 75# 15 x slow eccentrics Single Leg Press 31# 15 x slow eccentrics each leg   06/28/2024 Seated straight leg raises 2 sets of 10 for 3 seconds for 1# Seated knee extension machine 90-40 degrees (up both, down 1 leg only with slow eccentric contraction) 15# left and 10# right 15 reps each side (no rest needed and maybe go a bit heavier with both next visit)  Functional Activities: Double Leg Press 75# 15 x slow eccentrics Single Leg Press 31# 15 x slow eccentrics  Step-up and over 6 and 8 inch step with slow eccentrics and 1 hand assist 10 x each Step-down off 4 and 6 inch step (as far as comfortable) 10 x slow eccentrics each side at each step height; using 1 upper extremity as needed to keep the strain on the quadriceps and off the knee joint (easier on the left side)   PATIENT EDUCATION:  Education details: See above Person educated: Patient Education method: Explanation, Demonstration, Tactile cues, Verbal cues, and Handouts Education comprehension: verbalized understanding, returned demonstration, verbal cues required, tactile cues required, and needs further education  HOME EXERCISE PROGRAM: Access Code: [email or web address] URL: [email or web address] Date: 07/05/2024 Prepared by: Lamar Ivory  Exercises - Supine Quadricep Sets  - 3 x daily - 1 x weekly - 2 sets - 10 reps - 5 second hold - Small Range Straight Leg Raise  - 3 x daily - 3-7 x weekly - 2 sets - 5 reps - 3 seconds hold - Sit to Stand Without Arm Support  - 2 x daily - 3 x weekly - 1 sets - 5 reps - Seated Active Straight-Leg Raise  - 1 x daily - 1 x weekly - 2 sets - 5 reps - 3s hold - Standing Hip Hiking  - 1 x daily - 1 x weekly - 3 sets - 10 reps - 3 seconds hold - Tandem Stance  - 1 x daily - 3-5 x weekly - 1 sets - 5-10 reps - 20 second hold - Lateral Step Down  - 1 x daily - 3 x weekly - 2 sets - 10 reps  ASSESSMENT:  CLINICAL IMPRESSION: Dorr needed very little cuing with her HEP.  We again  discussed appropriate gym equipment and the importance of maintaining and improving quadriceps strength to protect his knee joints.  Rohith wanted to attend 1 additional  visit to ask any last minute questions before transition into independent rehabilitation.  OBJECTIVE IMPAIRMENTS: Abnormal gait, decreased activity tolerance, decreased endurance, decreased knowledge of condition, difficulty walking, decreased ROM, decreased strength, increased edema, impaired perceived functional ability, and pain.   ACTIVITY LIMITATIONS: bending, sitting, standing, squatting, stairs, and locomotion level  PARTICIPATION LIMITATIONS: community activity and occupation  PERSONAL FACTORS: Bil knee OA, stage 3 CKD, HTN are also affecting patient's functional outcome.   REHAB POTENTIAL: Good  CLINICAL DECISION MAKING: Evolving/moderate complexity  EVALUATION COMPLEXITY: Moderate   GOALS: Goals reviewed with patient? Yes  SHORT TERM GOALS: Target date: 05/05/2024 Kimarion will be independent with his D1 home exercise program Baseline: Started 04/07/2024 Goal status: Met 05/13/2024  2.  Improve bilateral quadriceps strength to 75/50 pounds or better Baseline: 62.3/38.2 pounds respectively Goal status: Partially Met 05/31/24   LONG TERM GOALS: Target date: 07/12/2024 6weeks     Improve patient specific functional scale to at least 6 Baseline: 4 Goal status: Met 05/26/2024  2.  Marko will report bilateral knee pain consistently 2-3/10 on the numeric pain rating scale Baseline: 2-5/10 Goal status: Met 05/19/2024  3.  Improve bilateral quadriceps strength to 80 pounds or greater Baseline: 62.3/38.2 pounds Goal status: Ongoing 05/31/24  4.  Anel will be independent with his long-term maintenance home exercise program at discharge Baseline: Started 04/07/2024 Goal status: Ongoing 07/05/24 PLAN:  PT FREQUENCY: 1 visit  PT DURATION: 1 week  PLANNED INTERVENTIONS: 97750- Physical Performance Testing,  97110-Therapeutic exercises, 97530- Therapeutic activity, 97112- Neuromuscular re-education, 97535- Self Care, 02859- Manual therapy, [id number]- Gait training, [phone]- Electrical stimulation (unattended), 97016- Vasopneumatic device, Patient/Family education, Balance training, Stair training, and Cryotherapy  PLAN FOR NEXT SESSION: DC, update long-term goals, close episode.    Myer LELON Ivory PT, MPT 07/05/24  4:19 PM

## 2024-07-11 NOTE — Therapy (Signed)
 OUTPATIENT PHYSICAL THERAPY LOWER EXTREMITY TREATMENT NOTE   Patient Name: Peter Russell MRN: [id number] DOB:04-07-1946, 78 y.o., male Today's Date: 07/11/2024   END OF SESSION:              Past Medical History:  Diagnosis Date   Arthritis    sternum and knees   CKD (chronic kidney disease) stage 3, GFR 30-59 ml/min (HCC) 2020   Hypertension    Prostate cancer [facility])    Past Surgical History:  Procedure Laterality Date   APPENDECTOMY     cataract surgery Bilateral    06/2021, 08/2021   ESOPHAGEAL MANOMETRY N/A 04/08/2023   Procedure: ESOPHAGEAL MANOMETRY (EM);  Surgeon: Rollin Dover, MD;  Location: [facility];  Service: Gastroenterology;  Laterality: N/A;   NASAL SINUS SURGERY     PROSTATE CRYOABLATION     Patient Active Problem List   Diagnosis Date Noted   Impaired fasting blood sugar 05/23/2024   Elevated uric acid in blood 05/23/2024   Primary osteoarthritis of right knee 03/22/2024   Primary osteoarthritis of left knee 03/22/2024   Sensorineural hearing loss, bilateral 08/27/2023   Impacted cerumen of both ears 08/27/2023   Aortic atherosclerosis 08/25/2023   Achalasia 08/25/2023   History of prostate cancer 08/25/2023   Shortness of breath 05/24/2021   PVCs (premature ventricular contractions) 05/24/2021   Atypical chest pain 05/24/2021   Personal history of rheumatoid arthritis 02/15/2021   Malignant neoplasm metastatic to bone (HCC) 02/15/2021   Screen for colon cancer 02/15/2021   Vision impairment 02/15/2021   Dysphagia 02/15/2021   Abnormal abdominal CT scan 02/15/2021   Screening for heart disease 02/02/2020   Vaccine counseling 02/02/2020   Chronic gout without tophus 02/02/2020   Advance directive discussed with patient 02/02/2020   Prostate cancer (HCC) 09/28/2019   Essential hypertension, benign 09/28/2019   Urine frequency 09/28/2019   OSA (obstructive sleep apnea) 09/28/2019   Vaccine refused by patient 09/28/2019   Medicare  annual wellness visit, subsequent 09/28/2019    PCP: Alm RAMAN. Tysinger, PA-C  REFERRING PROVIDER: Kay CHRISTELLA Cummins, MD  REFERRING DIAG: M17.11 (ICD-10-CM) - Primary osteoarthritis of right knee M17.12 (ICD-10-CM) - Primary osteoarthritis of left knee  THERAPY DIAG:  No diagnosis found.  Rationale for Evaluation and Treatment: Rehabilitation  ONSET DATE: A year ago  SUBJECTIVE:   SUBJECTIVE STATEMENT: *** Marx reports he is maintaining 2-3 x per week with his HEP.  Getting out of the car and descending ramps are still challenging.  PERTINENT HISTORY: Bil knee OA, stage 3 CKD, HTN  Stacie notes right > left knee pain of at least a year duration.  He is trying Meloxicam .  He notes trouble descending stairs and hills, difficulty squatting.    PAIN:  Are you having pain? Yes: NPRS scale: 0-3/10 this week Pain location: Bilateral medial knee joints right > left Pain description: Ache, stiff and sore Aggravating factors: Down his sloped driveway, in & out of the car Relieving factors: Meloxicam  (none in a month), Change of position and gentle movement  PRECAUTIONS: None  RED FLAGS: None   WEIGHT BEARING RESTRICTIONS: No  FALLS:  Has patient fallen in last 6 months? 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  DIAGNOSTIC FINDINGS: IMPRESSION: 1. Severe degenerative changes of the RIGHT knee medial compartment. 2. Moderate degenerative changes of the LEFT knee medial compartment. 3. Subcortical cyst formation with possible patellar surface irregularity along the medial facet of the  LEFT patella likely reflecting underlying cartilage deficiency/injury. 4. Small RIGHT knee joint effusion.  PATIENT SURVEYS:  PSFS: THE PATIENT SPECIFIC FUNCTIONAL SCALE  Place score of 0-10 (0 = unable to perform activity and 10 = able to perform activity at the same level as before injury or problem)  Activity Date: 04/07/2024 05/11/2024 05/26/2024 07/05/2024  Ascending stairs 6 7 8 8   2.  Kneeling 2 2 2 2   3.  Descending stairs or a slope like his driveway 4 5 9 9   4.       Total Score 4 4.67 6.33 6.33    Total Score = Sum of activity scores/number of activities  Minimally Detectable Change: 3 points (for single activity); 2 points (for average score)  [name] (nd). The Patient Specific Functional Scale . Retrieved from [email or web address]   COGNITION: Overall cognitive status: Within functional limits for tasks assessed     SENSATION: Infrequent foot and ankle tingling  EDEMA:  Noted and not objectively assessed   LOWER EXTREMITY ROM:  Active ROM Left/Right 04/07/2024   Hip flexion    Hip extension    Hip abduction    Hip adduction    Hip internal rotation    Hip external rotation    Knee flexion 132/126   Knee extension 3/2   Ankle dorsiflexion    Ankle plantarflexion    Ankle inversion    Ankle eversion     (Blank rows = not tested)  LOWER EXTREMITY STRENGTH:  Assessed in pounds with a hand-held dynamometer Left/Right 04/07/2024 Left/Right 05/04/2024 Left/Right 05/19/2024 Left/Right 05/31/24  Hip flexion      Hip extension      Hip abduction      Hip adduction      Hip internal rotation      Hip external rotation      Knee flexion      Knee extension 62.3/38.2 67.8/47.4 70.5/51.2 61.9/67.9  Ankle dorsiflexion      Ankle plantarflexion      Ankle inversion      Ankle eversion       (Blank rows = not tested)  GAIT: Distance walked: 100 feet Assistive device utilized: None Level of  assistance: Complete Independence Comments: Cassey notes he has difficulty with too much weightbearing or with longer periods of sitting                                                                                                                        TREATMENT DATE:  07/12/2024 ***  07/05/2024 NuStep Level 5 for 8 minutes Seated straight leg raises 2 sets of 10 for 3 seconds for 1# Seated  knee extension machine 90-40 degrees (up both, down 1 leg only with slow eccentric contraction) 20# left and 20# right 10 reps each side (use the other foot a bit at the end range to keep strain on the quads and not knees)  Functional Activities: Double Leg Press 81# 10 x slow eccentrics Single Leg Press 31# 15 x slow eccentrics  Step-up and over 8 inch step with slow eccentrics and 1 hand assist 10 x each side leading Step-down off 4 inch step (as far as comfortable) 2 sets 10 x slow eccentrics each side at each step height; using 1 upper extremity as needed to keep the strain on the quadriceps and off the knee joint (still easier on the left side)   07/01/24 Nustep level 5 6 min Seated knee extension machine 90-40 degrees (up both, down 1 leg only with slow eccentric contraction) 15# 10x each leg then 20# 8x each leg Incline board stretch 25 sec x3 Functional Activities: Double Leg Press 75# 15 x slow eccentrics Single Leg Press 31# 15 x slow eccentrics each leg   06/28/2024 Seated straight leg raises 2 sets of 10 for 3 seconds for 1# Seated knee extension machine 90-40 degrees (up both, down 1 leg only with slow eccentric contraction) 15# left and 10# right 15 reps each side (no rest needed and maybe go a bit heavier with both next visit)  Functional Activities: Double Leg Press 75# 15 x slow eccentrics Single Leg Press 31# 15 x slow eccentrics  Step-up and over 6 and 8 inch step with slow eccentrics and 1 hand assist 10 x each Step-down off 4 and 6 inch step (as far as comfortable) 10 x  slow eccentrics each side at each step height; using 1 upper extremity as needed to keep the strain on the quadriceps and off the knee joint (easier on the left side)   PATIENT EDUCATION:  Education details: See above Person educated: Patient Education method: Explanation, Demonstration, Tactile cues, Verbal cues, and Handouts Education comprehension: verbalized understanding, returned demonstration, verbal cues required, tactile cues required, and needs further education  HOME EXERCISE PROGRAM: Access Code: [email or web address] URL: [email or web address] Date: 07/05/2024 Prepared by: Lamar Ivory  Exercises - Supine Quadricep Sets  - 3 x daily - 1 x weekly - 2 sets - 10 reps - 5 second hold - Small Range Straight Leg Raise  - 3 x daily - 3-7 x weekly - 2 sets - 5 reps - 3 seconds hold - Sit to Stand Without Arm Support  - 2 x daily - 3 x weekly - 1 sets - 5 reps - Seated Active Straight-Leg Raise  - 1 x daily - 1 x weekly - 2 sets - 5 reps - 3s hold - Standing Hip Hiking  - 1 x daily - 1 x weekly - 3 sets - 10 reps - 3 seconds hold - Tandem Stance  - 1 x daily - 3-5 x weekly - 1 sets - 5-10 reps - 20 second hold - Lateral Step Down  - 1 x daily - 3 x weekly - 2 sets - 10 reps  ASSESSMENT:  CLINICAL IMPRESSION: PIERRETTE Standing needed very little cuing with her HEP.  We again discussed appropriate gym equipment and the importance of maintaining and improving quadriceps strength to protect his knee joints.  Delmore wanted to attend 1 additional visit to ask any last minute questions before transition into independent rehabilitation.  OBJECTIVE IMPAIRMENTS: Abnormal gait, decreased activity tolerance, decreased endurance, decreased knowledge  of condition, difficulty walking, decreased ROM, decreased strength, increased edema, impaired perceived functional ability, and pain.   ACTIVITY LIMITATIONS: bending, sitting, standing, squatting, stairs, and locomotion level  PARTICIPATION  LIMITATIONS: community activity and occupation  PERSONAL FACTORS: Bil knee OA, stage 3 CKD, HTN are also affecting patient's functional outcome.   REHAB POTENTIAL: Good  CLINICAL DECISION MAKING: Evolving/moderate complexity  EVALUATION COMPLEXITY: Moderate   GOALS: Goals reviewed with patient? Yes  SHORT TERM GOALS: Target date: 05/05/2024 Aiman will be independent with his D1 home exercise program Baseline: Started 04/07/2024 Goal status: Met 05/13/2024  2.  Improve bilateral quadriceps strength to 75/50 pounds or better Baseline: 62.3/38.2 pounds respectively Goal status: Partially Met 05/31/24   LONG TERM GOALS: Target date: 07/12/2024 6weeks     Improve patient specific functional scale to at least 6 Baseline: 4 Goal status: Met 05/26/2024  2.  Karey will report bilateral knee pain consistently 2-3/10 on the numeric pain rating scale Baseline: 2-5/10 Goal status: Met 05/19/2024  3.  Improve bilateral quadriceps strength to 80 pounds or greater Baseline: 62.3/38.2 pounds Goal status: Ongoing 05/31/24  4.  Archibald will be independent with his long-term maintenance home exercise program at discharge Baseline: Started 04/07/2024 Goal status: Ongoing 07/05/24 PLAN:  PT FREQUENCY: 1 visit  PT DURATION: 1 week  PLANNED INTERVENTIONS: 97750- Physical Performance Testing, 97110-Therapeutic exercises, 97530- Therapeutic activity, 97112- Neuromuscular re-education, 97535- Self Care, 02859- Manual therapy, [id number]- Gait training, [phone]- Electrical stimulation (unattended), 97016- Vasopneumatic device, Patient/Family education, Balance training, Stair training, and Cryotherapy  PLAN FOR NEXT SESSION: DC, update long-term goals, close episode.  ***   PHYSICAL THERAPY DISCHARGE SUMMARY  Visits from Start of Care: ***  Current functional level related to goals / functional outcomes: ***   Remaining deficits: ***   Education / Equipment: ***   Patient agrees to  discharge. Patient goals were {OP Goals:25702::met}. Patient is being discharged due to {OP Discharge Reasons:25703::meeting the stated rehab goals.}   Susannah Daring, PT, DPT 07/11/24 8:14 AM

## 2024-07-12 ENCOUNTER — Ambulatory Visit

## 2024-07-12 DIAGNOSIS — M6281 Muscle weakness (generalized): Secondary | ICD-10-CM | POA: Diagnosis not present

## 2024-07-12 DIAGNOSIS — R262 Difficulty in walking, not elsewhere classified: Secondary | ICD-10-CM

## 2024-07-12 DIAGNOSIS — M25561 Pain in right knee: Secondary | ICD-10-CM | POA: Diagnosis not present

## 2024-07-12 DIAGNOSIS — M25662 Stiffness of left knee, not elsewhere classified: Secondary | ICD-10-CM

## 2024-07-12 DIAGNOSIS — M25562 Pain in left knee: Secondary | ICD-10-CM

## 2024-07-12 DIAGNOSIS — R600 Localized edema: Secondary | ICD-10-CM | POA: Diagnosis not present

## 2024-07-12 DIAGNOSIS — M25661 Stiffness of right knee, not elsewhere classified: Secondary | ICD-10-CM

## 2024-07-12 DIAGNOSIS — G8929 Other chronic pain: Secondary | ICD-10-CM

## 2024-08-23 ENCOUNTER — Telehealth (INDEPENDENT_AMBULATORY_CARE_PROVIDER_SITE_OTHER): Payer: Self-pay | Admitting: Otolaryngology

## 2024-08-23 NOTE — Telephone Encounter (Signed)
 Left message for patient to call back to reschedule 09/19/2024 appointment.  Rosaline not in office.

## 2024-09-15 ENCOUNTER — Other Ambulatory Visit (INDEPENDENT_AMBULATORY_CARE_PROVIDER_SITE_OTHER): Payer: Self-pay | Admitting: Otolaryngology

## 2024-09-15 DIAGNOSIS — H903 Sensorineural hearing loss, bilateral: Secondary | ICD-10-CM

## 2024-09-19 ENCOUNTER — Ambulatory Visit (INDEPENDENT_AMBULATORY_CARE_PROVIDER_SITE_OTHER)

## 2024-09-19 ENCOUNTER — Ambulatory Visit (INDEPENDENT_AMBULATORY_CARE_PROVIDER_SITE_OTHER): Admitting: Otolaryngology

## 2024-09-23 ENCOUNTER — Encounter: Payer: Self-pay | Admitting: Medical

## 2025-03-21 ENCOUNTER — Ambulatory Visit: Payer: Self-pay
